# Patient Record
Sex: Female | Born: 1955 | Race: Black or African American | Hispanic: No | Marital: Married | Smoking: Current every day smoker
Health system: Southern US, Community
[De-identification: ages and names within clinical notes are randomized; demographics above are authoritative.]

## PROBLEM LIST (undated history)

## (undated) DIAGNOSIS — M311 Thrombotic microangiopathy: Secondary | ICD-10-CM

## (undated) DIAGNOSIS — F329 Major depressive disorder, single episode, unspecified: Secondary | ICD-10-CM

## (undated) DIAGNOSIS — R52 Pain, unspecified: Secondary | ICD-10-CM

## (undated) DIAGNOSIS — G4733 Obstructive sleep apnea (adult) (pediatric): Secondary | ICD-10-CM

## (undated) DIAGNOSIS — J449 Chronic obstructive pulmonary disease, unspecified: Secondary | ICD-10-CM

## (undated) DIAGNOSIS — E7800 Pure hypercholesterolemia, unspecified: Secondary | ICD-10-CM

## (undated) DIAGNOSIS — K219 Gastro-esophageal reflux disease without esophagitis: Secondary | ICD-10-CM

## (undated) DIAGNOSIS — R252 Cramp and spasm: Secondary | ICD-10-CM

## (undated) DIAGNOSIS — IMO0002 Reserved for concepts with insufficient information to code with codable children: Secondary | ICD-10-CM

## (undated) DIAGNOSIS — E559 Vitamin D deficiency, unspecified: Secondary | ICD-10-CM

## (undated) DIAGNOSIS — I10 Essential (primary) hypertension: Secondary | ICD-10-CM

## (undated) DIAGNOSIS — M1711 Unilateral primary osteoarthritis, right knee: Secondary | ICD-10-CM

## (undated) DIAGNOSIS — N183 Chronic kidney disease, stage 3 (moderate): Secondary | ICD-10-CM

## (undated) DIAGNOSIS — R1310 Dysphagia, unspecified: Secondary | ICD-10-CM

## (undated) DIAGNOSIS — M797 Fibromyalgia: Secondary | ICD-10-CM

## (undated) DIAGNOSIS — M1990 Unspecified osteoarthritis, unspecified site: Secondary | ICD-10-CM

## (undated) DIAGNOSIS — I639 Cerebral infarction, unspecified: Secondary | ICD-10-CM

## (undated) DIAGNOSIS — E538 Deficiency of other specified B group vitamins: Secondary | ICD-10-CM

## (undated) DIAGNOSIS — F32A Depression, unspecified: Secondary | ICD-10-CM

## (undated) DIAGNOSIS — Z9889 Other specified postprocedural states: Secondary | ICD-10-CM

## (undated) DIAGNOSIS — I2510 Atherosclerotic heart disease of native coronary artery without angina pectoris: Secondary | ICD-10-CM

## (undated) DIAGNOSIS — R7303 Prediabetes: Secondary | ICD-10-CM

## (undated) DIAGNOSIS — R569 Unspecified convulsions: Secondary | ICD-10-CM

## (undated) DIAGNOSIS — M329 Systemic lupus erythematosus, unspecified: Secondary | ICD-10-CM

## (undated) DIAGNOSIS — R519 Headache, unspecified: Secondary | ICD-10-CM

## (undated) DIAGNOSIS — Z8739 Personal history of other diseases of the musculoskeletal system and connective tissue: Secondary | ICD-10-CM

## (undated) DIAGNOSIS — R0600 Dyspnea, unspecified: Secondary | ICD-10-CM

## (undated) DIAGNOSIS — Z9989 Dependence on other enabling machines and devices: Secondary | ICD-10-CM

## (undated) DIAGNOSIS — M19011 Primary osteoarthritis, right shoulder: Secondary | ICD-10-CM

## (undated) DIAGNOSIS — B019 Varicella without complication: Secondary | ICD-10-CM

## (undated) DIAGNOSIS — Z8673 Personal history of transient ischemic attack (TIA), and cerebral infarction without residual deficits: Secondary | ICD-10-CM

## (undated) DIAGNOSIS — J45909 Unspecified asthma, uncomplicated: Secondary | ICD-10-CM

## (undated) DIAGNOSIS — M3119 Other thrombotic microangiopathy: Secondary | ICD-10-CM

## (undated) DIAGNOSIS — R410 Disorientation, unspecified: Secondary | ICD-10-CM

## (undated) DIAGNOSIS — M359 Systemic involvement of connective tissue, unspecified: Secondary | ICD-10-CM

## (undated) DIAGNOSIS — J309 Allergic rhinitis, unspecified: Secondary | ICD-10-CM

## (undated) DIAGNOSIS — M7540 Impingement syndrome of unspecified shoulder: Secondary | ICD-10-CM

## (undated) HISTORY — PX: REPLACEMENT TOTAL KNEE: SUR1224

## (undated) HISTORY — DX: Chronic obstructive pulmonary disease, unspecified: J44.9

## (undated) HISTORY — DX: Reserved for concepts with insufficient information to code with codable children: IMO0002

## (undated) HISTORY — DX: Personal history of transient ischemic attack (TIA), and cerebral infarction without residual deficits: Z86.73

## (undated) HISTORY — DX: Dependence on other enabling machines and devices: Z99.89

## (undated) HISTORY — PX: ROTATOR CUFF REPAIR: SHX139

## (undated) HISTORY — DX: Thrombotic microangiopathy: M31.1

## (undated) HISTORY — DX: Unilateral primary osteoarthritis, right knee: M17.11

## (undated) HISTORY — DX: Chronic kidney disease, stage 3 (moderate): N18.3

## (undated) HISTORY — DX: Impingement syndrome of unspecified shoulder: M75.40

## (undated) HISTORY — DX: Obstructive sleep apnea (adult) (pediatric): G47.33

## (undated) HISTORY — DX: Atherosclerotic heart disease of native coronary artery without angina pectoris: I25.10

## (undated) HISTORY — PX: ABDOMINAL HYSTERECTOMY: SHX81

## (undated) HISTORY — DX: Pure hypercholesterolemia, unspecified: E78.00

---

## 2004-04-02 ENCOUNTER — Emergency Department: Payer: Self-pay | Admitting: Emergency Medicine

## 2004-04-03 ENCOUNTER — Other Ambulatory Visit: Payer: Self-pay

## 2004-04-03 ENCOUNTER — Inpatient Hospital Stay: Payer: Self-pay | Admitting: Internal Medicine

## 2004-04-13 ENCOUNTER — Ambulatory Visit: Payer: Self-pay | Admitting: Internal Medicine

## 2004-05-05 ENCOUNTER — Ambulatory Visit: Payer: Self-pay | Admitting: Internal Medicine

## 2004-06-04 ENCOUNTER — Ambulatory Visit: Payer: Self-pay | Admitting: Internal Medicine

## 2004-06-26 ENCOUNTER — Ambulatory Visit: Payer: Self-pay | Admitting: Family Medicine

## 2004-07-05 ENCOUNTER — Ambulatory Visit: Payer: Self-pay | Admitting: Internal Medicine

## 2004-08-04 ENCOUNTER — Ambulatory Visit: Payer: Self-pay | Admitting: Internal Medicine

## 2004-09-04 ENCOUNTER — Ambulatory Visit: Payer: Self-pay | Admitting: Internal Medicine

## 2004-10-05 ENCOUNTER — Ambulatory Visit: Payer: Self-pay | Admitting: Internal Medicine

## 2004-11-04 ENCOUNTER — Ambulatory Visit: Payer: Self-pay | Admitting: Internal Medicine

## 2004-12-05 ENCOUNTER — Ambulatory Visit: Payer: Self-pay | Admitting: Internal Medicine

## 2005-09-10 ENCOUNTER — Emergency Department: Payer: Self-pay | Admitting: Emergency Medicine

## 2005-10-15 ENCOUNTER — Ambulatory Visit: Payer: Self-pay | Admitting: Internal Medicine

## 2006-02-05 ENCOUNTER — Ambulatory Visit: Payer: Self-pay | Admitting: Gastroenterology

## 2006-02-17 ENCOUNTER — Ambulatory Visit: Payer: Self-pay | Admitting: Internal Medicine

## 2006-09-02 ENCOUNTER — Ambulatory Visit: Payer: Self-pay | Admitting: Orthopaedic Surgery

## 2006-10-02 ENCOUNTER — Ambulatory Visit: Payer: Self-pay | Admitting: Pain Medicine

## 2006-11-03 ENCOUNTER — Ambulatory Visit: Payer: Self-pay | Admitting: Pain Medicine

## 2006-11-17 ENCOUNTER — Ambulatory Visit: Payer: Self-pay | Admitting: Pain Medicine

## 2006-12-11 ENCOUNTER — Ambulatory Visit: Payer: Self-pay | Admitting: Emergency Medicine

## 2006-12-25 ENCOUNTER — Ambulatory Visit: Payer: Self-pay | Admitting: Pain Medicine

## 2007-01-05 DIAGNOSIS — Z8673 Personal history of transient ischemic attack (TIA), and cerebral infarction without residual deficits: Secondary | ICD-10-CM

## 2007-01-05 HISTORY — DX: Personal history of transient ischemic attack (TIA), and cerebral infarction without residual deficits: Z86.73

## 2007-01-12 ENCOUNTER — Inpatient Hospital Stay: Payer: Self-pay | Admitting: Internal Medicine

## 2007-01-12 ENCOUNTER — Other Ambulatory Visit: Payer: Self-pay

## 2007-04-10 ENCOUNTER — Ambulatory Visit: Payer: Self-pay | Admitting: Pain Medicine

## 2007-04-27 ENCOUNTER — Ambulatory Visit: Payer: Self-pay | Admitting: Pain Medicine

## 2007-07-16 ENCOUNTER — Ambulatory Visit: Payer: Self-pay | Admitting: Pain Medicine

## 2007-07-27 ENCOUNTER — Ambulatory Visit: Payer: Self-pay | Admitting: Pain Medicine

## 2007-10-06 ENCOUNTER — Other Ambulatory Visit: Payer: Self-pay

## 2007-10-06 ENCOUNTER — Ambulatory Visit: Payer: Self-pay | Admitting: Unknown Physician Specialty

## 2007-10-13 ENCOUNTER — Ambulatory Visit: Payer: Self-pay | Admitting: Unknown Physician Specialty

## 2008-02-01 ENCOUNTER — Ambulatory Visit: Payer: Self-pay | Admitting: Internal Medicine

## 2008-04-11 ENCOUNTER — Ambulatory Visit: Payer: Self-pay | Admitting: Unknown Physician Specialty

## 2010-01-02 ENCOUNTER — Ambulatory Visit: Payer: Self-pay | Admitting: Unknown Physician Specialty

## 2010-01-09 ENCOUNTER — Ambulatory Visit: Payer: Self-pay | Admitting: Unknown Physician Specialty

## 2010-02-22 ENCOUNTER — Ambulatory Visit: Payer: Self-pay | Admitting: Unknown Physician Specialty

## 2010-03-22 ENCOUNTER — Ambulatory Visit: Payer: Self-pay | Admitting: Internal Medicine

## 2010-06-24 ENCOUNTER — Ambulatory Visit: Payer: Self-pay | Admitting: Surgery

## 2011-04-24 ENCOUNTER — Ambulatory Visit: Payer: Self-pay | Admitting: Internal Medicine

## 2011-05-22 ENCOUNTER — Ambulatory Visit: Payer: Self-pay | Admitting: Gastroenterology

## 2012-04-28 ENCOUNTER — Ambulatory Visit: Payer: Self-pay | Admitting: Internal Medicine

## 2013-01-07 ENCOUNTER — Ambulatory Visit: Payer: Self-pay | Admitting: Internal Medicine

## 2013-01-21 ENCOUNTER — Ambulatory Visit: Payer: Self-pay | Admitting: Gastroenterology

## 2013-01-22 LAB — PATHOLOGY REPORT

## 2013-04-14 DIAGNOSIS — M359 Systemic involvement of connective tissue, unspecified: Secondary | ICD-10-CM | POA: Insufficient documentation

## 2013-04-14 DIAGNOSIS — M1990 Unspecified osteoarthritis, unspecified site: Secondary | ICD-10-CM | POA: Insufficient documentation

## 2013-04-14 DIAGNOSIS — I2510 Atherosclerotic heart disease of native coronary artery without angina pectoris: Secondary | ICD-10-CM

## 2013-04-14 HISTORY — DX: Atherosclerotic heart disease of native coronary artery without angina pectoris: I25.10

## 2013-05-10 ENCOUNTER — Ambulatory Visit: Payer: Self-pay | Admitting: Obstetrics and Gynecology

## 2013-05-18 ENCOUNTER — Ambulatory Visit: Payer: Self-pay | Admitting: Internal Medicine

## 2013-06-14 ENCOUNTER — Emergency Department: Payer: Self-pay | Admitting: Emergency Medicine

## 2013-07-27 ENCOUNTER — Ambulatory Visit: Payer: Self-pay | Admitting: Rheumatology

## 2013-08-23 DIAGNOSIS — IMO0002 Reserved for concepts with insufficient information to code with codable children: Secondary | ICD-10-CM | POA: Insufficient documentation

## 2013-08-23 DIAGNOSIS — M1711 Unilateral primary osteoarthritis, right knee: Secondary | ICD-10-CM

## 2013-08-23 HISTORY — DX: Unilateral primary osteoarthritis, right knee: M17.11

## 2013-08-23 HISTORY — DX: Reserved for concepts with insufficient information to code with codable children: IMO0002

## 2013-10-07 DIAGNOSIS — Z9989 Dependence on other enabling machines and devices: Secondary | ICD-10-CM

## 2013-10-07 DIAGNOSIS — E7800 Pure hypercholesterolemia, unspecified: Secondary | ICD-10-CM

## 2013-10-07 DIAGNOSIS — J449 Chronic obstructive pulmonary disease, unspecified: Secondary | ICD-10-CM

## 2013-10-07 DIAGNOSIS — E538 Deficiency of other specified B group vitamins: Secondary | ICD-10-CM | POA: Insufficient documentation

## 2013-10-07 DIAGNOSIS — G4700 Insomnia, unspecified: Secondary | ICD-10-CM | POA: Insufficient documentation

## 2013-10-07 DIAGNOSIS — N183 Chronic kidney disease, stage 3 unspecified: Secondary | ICD-10-CM | POA: Insufficient documentation

## 2013-10-07 DIAGNOSIS — M311 Thrombotic microangiopathy: Secondary | ICD-10-CM | POA: Insufficient documentation

## 2013-10-07 DIAGNOSIS — I10 Essential (primary) hypertension: Secondary | ICD-10-CM | POA: Insufficient documentation

## 2013-10-07 DIAGNOSIS — L732 Hidradenitis suppurativa: Secondary | ICD-10-CM | POA: Insufficient documentation

## 2013-10-07 DIAGNOSIS — M7540 Impingement syndrome of unspecified shoulder: Secondary | ICD-10-CM

## 2013-10-07 DIAGNOSIS — E559 Vitamin D deficiency, unspecified: Secondary | ICD-10-CM | POA: Insufficient documentation

## 2013-10-07 DIAGNOSIS — G4733 Obstructive sleep apnea (adult) (pediatric): Secondary | ICD-10-CM | POA: Insufficient documentation

## 2013-10-07 DIAGNOSIS — M3119 Other thrombotic microangiopathy: Secondary | ICD-10-CM

## 2013-10-07 HISTORY — DX: Chronic kidney disease, stage 3 unspecified: N18.30

## 2013-10-07 HISTORY — DX: Obstructive sleep apnea (adult) (pediatric): G47.33

## 2013-10-07 HISTORY — DX: Chronic obstructive pulmonary disease, unspecified: J44.9

## 2013-10-07 HISTORY — DX: Impingement syndrome of unspecified shoulder: M75.40

## 2013-10-07 HISTORY — DX: Pure hypercholesterolemia, unspecified: E78.00

## 2013-10-07 HISTORY — DX: Other thrombotic microangiopathy: M31.19

## 2013-10-18 ENCOUNTER — Ambulatory Visit: Payer: Self-pay | Admitting: Unknown Physician Specialty

## 2014-01-11 ENCOUNTER — Ambulatory Visit: Payer: Self-pay | Admitting: Orthopedic Surgery

## 2014-02-04 HISTORY — PX: JOINT REPLACEMENT: SHX530

## 2014-02-09 ENCOUNTER — Ambulatory Visit: Payer: Self-pay | Admitting: Orthopedic Surgery

## 2014-02-09 LAB — URINALYSIS, COMPLETE
Bacteria: NONE SEEN
Bilirubin,UR: NEGATIVE
Blood: NEGATIVE
Glucose,UR: NEGATIVE mg/dL (ref 0–75)
Ketone: NEGATIVE
Leukocyte Esterase: NEGATIVE
Nitrite: NEGATIVE
Ph: 5 (ref 4.5–8.0)
Protein: NEGATIVE
RBC,UR: <1 /HPF (ref 0–5)
Specific Gravity: 1.01 (ref 1.003–1.030)
Squamous Epithelial: NONE SEEN
WBC UR: <1 /HPF (ref 0–5)

## 2014-02-09 LAB — BASIC METABOLIC PANEL
Anion Gap: 5 — ABNORMAL LOW (ref 7–16)
BUN: 20 mg/dL — ABNORMAL HIGH (ref 7–18)
Calcium, Total: 8.5 mg/dL (ref 8.5–10.1)
Chloride: 106 mmol/L (ref 98–107)
Co2: 29 mmol/L (ref 21–32)
Creatinine: 1.24 mg/dL (ref 0.60–1.30)
EGFR (African American): 57 — ABNORMAL LOW
EGFR (Non-African Amer.): 47 — ABNORMAL LOW
Glucose: 91 mg/dL (ref 65–99)
Osmolality: 282 (ref 275–301)
Potassium: 3.6 mmol/L (ref 3.5–5.1)
Sodium: 140 mmol/L (ref 136–145)

## 2014-02-09 LAB — CBC
HCT: 36.2 % (ref 35.0–47.0)
HGB: 11.5 g/dL — ABNORMAL LOW (ref 12.0–16.0)
MCH: 28.7 pg (ref 26.0–34.0)
MCHC: 31.9 g/dL — ABNORMAL LOW (ref 32.0–36.0)
MCV: 90 fL (ref 80–100)
Platelet: 268 10*3/uL (ref 150–440)
RBC: 4.02 10*6/uL (ref 3.80–5.20)
RDW: 13.7 % (ref 11.5–14.5)
WBC: 8.2 10*3/uL (ref 3.6–11.0)

## 2014-02-09 LAB — MRSA PCR SCREENING

## 2014-02-09 LAB — APTT: Activated PTT: 31 secs (ref 23.6–35.9)

## 2014-02-09 LAB — PROTIME-INR
INR: 1.1
Prothrombin Time: 13.9 secs (ref 11.5–14.7)

## 2014-02-09 LAB — SEDIMENTATION RATE: Erythrocyte Sed Rate: 41 mm/hr — ABNORMAL HIGH (ref 0–30)

## 2014-02-11 LAB — URINE CULTURE

## 2014-02-22 ENCOUNTER — Inpatient Hospital Stay: Payer: Self-pay | Admitting: Orthopedic Surgery

## 2014-02-23 LAB — BASIC METABOLIC PANEL
Anion Gap: 6 — ABNORMAL LOW (ref 7–16)
BUN: 15 mg/dL (ref 7–18)
Calcium, Total: 7.8 mg/dL — ABNORMAL LOW (ref 8.5–10.1)
Chloride: 102 mmol/L (ref 98–107)
Co2: 25 mmol/L (ref 21–32)
Creatinine: 1.2 mg/dL (ref 0.60–1.30)
EGFR (African American): 60 — ABNORMAL LOW
EGFR (Non-African Amer.): 49 — ABNORMAL LOW
Glucose: 132 mg/dL — ABNORMAL HIGH (ref 65–99)
Osmolality: 269 (ref 275–301)
Potassium: 3.8 mmol/L (ref 3.5–5.1)
Sodium: 133 mmol/L — ABNORMAL LOW (ref 136–145)

## 2014-02-23 LAB — PLATELET COUNT: Platelet: 236 10*3/uL (ref 150–440)

## 2014-02-23 LAB — HEMOGLOBIN: HGB: 9.7 g/dL — ABNORMAL LOW (ref 12.0–16.0)

## 2014-02-24 LAB — BASIC METABOLIC PANEL
Anion Gap: 6 — ABNORMAL LOW (ref 7–16)
BUN: 8 mg/dL (ref 7–18)
Calcium, Total: 8.3 mg/dL — ABNORMAL LOW (ref 8.5–10.1)
Chloride: 102 mmol/L (ref 98–107)
Co2: 28 mmol/L (ref 21–32)
Creatinine: 0.95 mg/dL (ref 0.60–1.30)
EGFR (African American): >60
EGFR (Non-African Amer.): >60
Glucose: 112 mg/dL — ABNORMAL HIGH (ref 65–99)
Osmolality: 271 (ref 275–301)
Potassium: 3.8 mmol/L (ref 3.5–5.1)
Sodium: 136 mmol/L (ref 136–145)

## 2014-02-24 LAB — HEMOGLOBIN: HGB: 9.6 g/dL — ABNORMAL LOW (ref 12.0–16.0)

## 2014-05-30 LAB — SURGICAL PATHOLOGY

## 2014-06-05 NOTE — Op Note (Signed)
PATIENT NAME:  Bailey Dominguez, Bailey Dominguez MR#:  [id number] DATE OF BIRTH:  1955-05-06  DATE OF PROCEDURE:  02/22/2014  PREOPERATIVE DIAGNOSIS: Right knee primary osteoarthritis.   POSTOPERATIVE DIAGNOSIS:  Right knee primary osteoarthritis.     PROCEDURE: Right total knee replacement.   SURGEON: Hessie Knows, MD  ASSISTANT:  Rachelle Hora, PA-C.   ANESTHESIA: Spinal.   DESCRIPTION OF PROCEDURE: The patient was brought to the Operating Room and after adequate anesthesia was obtained, the right leg was prepped and draped in the usual sterile fashion with tourniquet applied to the upper thigh  The patient identification and timeout procedures were completed.  The leg was exsanguinated with an Esmarch and the tourniquet raised to 300 mmHg.  A midline skin incision was made, followed by a medial parapatellar arthrotomy with clearly complete loss of articular cartilage in the medial femoral condyle and large portions of the medial tibial condyles as well as patellofemoral joint, trochlea and patella. The lateral compartment was relatively spared with just pitting of the articular cartilage. There is also moderate synovitis of the suprapatellar pouch.  ACL and PCL were excised along with the fat pad and the proximal tibia exposed and Medacta cutting guide was applied and the proximal tibia cut carried out, followed by the identical procedure on the distal femur.  After the distal femur cut, the 2 cutting guide was applied, anterior, posterior and chamfer cuts created, and trials were placed and a long stemmed tibia endplate was utilized since the patient had soft bone and because of her weight, so a proximal drill hole was made, followed by hand reaming.  The trials were placed and there was good patellofemoral tracking and a size 10 was somewhat lax and 11 fit very well.  The distal femoral drill holes were made in the trochlear groove.  Notch was created using the guide, and trials were removed at this point and the  patella cut using the patellar cutting guide, resecting appropriate amount patella was drilled, and the patella sized to a size 1. At this point, the periarticular tissue was infiltrated with a combination of dilute Exparel as well as a combination of morphine and 0.25% Sensorcaine with epinephrine.  The bony surfaces were thoroughly irrigated and dried. Tibial component was cemented into place first, followed by the femoral component with an 11 mm trial. Trial fit very well and so the 11 mm final insert was placed and set screws placed as well. The patella was cemented into place as well and the clamp used to hold this, the knee was held in extension, as the cement set.  After the cement had set, excess cement was removed.  The knee was thoroughly irrigated and then a Betadine soak for 5 minutes was carried out.  Tourniquet was let down and hemostasis checked with electrocautery. The wound was irrigated and then closed with a heavy Quill suture, 2-0 Quill subcutaneously and skin staples.  Xeroform, 4 x 4's, ABDs, Webril Polar Care and Ace wrap applied. The patient was sent to the recovery room in stable condition.   ESTIMATED BLOOD LOSS: 50.   COMPLICATIONS: None.   SPECIMEN: Cut ends of bone.   TOURNIQUET TIME: 80 minutes at 300 mmHg.   IMPLANTS: Medacta GMK sphere, 2 right femur with an 11 mm, 2 right Flex insert. A 2 right tibial tray with a 11 x 65 mm stem attachment and a size 1 patella.  All components cemented.    CONDITION TO RECOVERY ROOM:  Stable.  COMPLICATIONS: None.      ____________________________ Laurene Footman, MD mjm:DT D: 02/22/2014 11:47:47 ET T: 02/22/2014 12:25:34 ET JOB#: [id number]  cc: Laurene Footman, MD, <Dictator> Laurene Footman MD ELECTRONICALLY SIGNED 02/22/2014 19:00

## 2015-03-07 ENCOUNTER — Other Ambulatory Visit: Payer: Self-pay | Admitting: Rheumatology

## 2015-03-07 DIAGNOSIS — M542 Cervicalgia: Secondary | ICD-10-CM

## 2015-03-29 ENCOUNTER — Ambulatory Visit
Admission: RE | Admit: 2015-03-29 | Discharge: 2015-03-29 | Disposition: A | Discharge status: Home / Self Care | Payer: Medicare Other | Source: Ambulatory Visit | Attending: Rheumatology | Admitting: Rheumatology

## 2015-03-29 DIAGNOSIS — M4802 Spinal stenosis, cervical region: Secondary | ICD-10-CM | POA: Diagnosis not present

## 2015-03-29 DIAGNOSIS — M4692 Unspecified inflammatory spondylopathy, cervical region: Secondary | ICD-10-CM | POA: Insufficient documentation

## 2015-03-29 DIAGNOSIS — M50221 Other cervical disc displacement at C4-C5 level: Secondary | ICD-10-CM | POA: Diagnosis not present

## 2015-03-29 DIAGNOSIS — M542 Cervicalgia: Secondary | ICD-10-CM | POA: Diagnosis present

## 2015-04-21 DIAGNOSIS — Z79899 Other long term (current) drug therapy: Secondary | ICD-10-CM | POA: Insufficient documentation

## 2015-04-24 ENCOUNTER — Other Ambulatory Visit: Payer: Self-pay | Admitting: Internal Medicine

## 2015-04-24 DIAGNOSIS — Z1231 Encounter for screening mammogram for malignant neoplasm of breast: Secondary | ICD-10-CM

## 2015-05-01 ENCOUNTER — Other Ambulatory Visit: Payer: Self-pay | Admitting: Physician Assistant

## 2015-05-01 DIAGNOSIS — R1319 Other dysphagia: Secondary | ICD-10-CM

## 2015-05-09 ENCOUNTER — Ambulatory Visit
Admission: RE | Admit: 2015-05-09 | Discharge: 2015-05-09 | Disposition: A | Discharge status: Home / Self Care | Payer: Medicare Other | Source: Ambulatory Visit | Attending: Physician Assistant | Admitting: Physician Assistant

## 2015-05-09 DIAGNOSIS — K219 Gastro-esophageal reflux disease without esophagitis: Secondary | ICD-10-CM | POA: Diagnosis present

## 2015-05-09 DIAGNOSIS — R1319 Other dysphagia: Secondary | ICD-10-CM | POA: Diagnosis not present

## 2015-08-14 ENCOUNTER — Encounter: Payer: Self-pay | Admitting: *Deleted

## 2015-08-15 ENCOUNTER — Ambulatory Visit: Payer: Medicare Other | Admitting: Anesthesiology

## 2015-08-15 ENCOUNTER — Encounter
Admission: RE | Disposition: A | Discharge status: Home / Self Care | Payer: Self-pay | Source: Ambulatory Visit | Attending: Gastroenterology

## 2015-08-15 ENCOUNTER — Encounter: Payer: Self-pay | Admitting: *Deleted

## 2015-08-15 ENCOUNTER — Ambulatory Visit
Admission: RE | Admit: 2015-08-15 | Discharge: 2015-08-15 | Disposition: A | Discharge status: Home / Self Care | Payer: Medicare Other | Source: Ambulatory Visit | Attending: Gastroenterology | Admitting: Gastroenterology

## 2015-08-15 DIAGNOSIS — Z79899 Other long term (current) drug therapy: Secondary | ICD-10-CM | POA: Insufficient documentation

## 2015-08-15 DIAGNOSIS — J449 Chronic obstructive pulmonary disease, unspecified: Secondary | ICD-10-CM | POA: Diagnosis not present

## 2015-08-15 DIAGNOSIS — Z885 Allergy status to narcotic agent status: Secondary | ICD-10-CM | POA: Diagnosis not present

## 2015-08-15 DIAGNOSIS — Z862 Personal history of diseases of the blood and blood-forming organs and certain disorders involving the immune mechanism: Secondary | ICD-10-CM | POA: Diagnosis not present

## 2015-08-15 DIAGNOSIS — Z6833 Body mass index (BMI) 33.0-33.9, adult: Secondary | ICD-10-CM | POA: Insufficient documentation

## 2015-08-15 DIAGNOSIS — E559 Vitamin D deficiency, unspecified: Secondary | ICD-10-CM | POA: Insufficient documentation

## 2015-08-15 DIAGNOSIS — K222 Esophageal obstruction: Secondary | ICD-10-CM | POA: Insufficient documentation

## 2015-08-15 DIAGNOSIS — G4733 Obstructive sleep apnea (adult) (pediatric): Secondary | ICD-10-CM | POA: Insufficient documentation

## 2015-08-15 DIAGNOSIS — Z7902 Long term (current) use of antithrombotics/antiplatelets: Secondary | ICD-10-CM | POA: Insufficient documentation

## 2015-08-15 DIAGNOSIS — F329 Major depressive disorder, single episode, unspecified: Secondary | ICD-10-CM | POA: Insufficient documentation

## 2015-08-15 DIAGNOSIS — Z87891 Personal history of nicotine dependence: Secondary | ICD-10-CM | POA: Diagnosis not present

## 2015-08-15 DIAGNOSIS — E538 Deficiency of other specified B group vitamins: Secondary | ICD-10-CM | POA: Diagnosis not present

## 2015-08-15 DIAGNOSIS — M1990 Unspecified osteoarthritis, unspecified site: Secondary | ICD-10-CM | POA: Insufficient documentation

## 2015-08-15 DIAGNOSIS — J309 Allergic rhinitis, unspecified: Secondary | ICD-10-CM | POA: Diagnosis not present

## 2015-08-15 DIAGNOSIS — I10 Essential (primary) hypertension: Secondary | ICD-10-CM | POA: Diagnosis not present

## 2015-08-15 DIAGNOSIS — R1310 Dysphagia, unspecified: Secondary | ICD-10-CM | POA: Diagnosis present

## 2015-08-15 DIAGNOSIS — K2950 Unspecified chronic gastritis without bleeding: Secondary | ICD-10-CM | POA: Insufficient documentation

## 2015-08-15 DIAGNOSIS — Z8673 Personal history of transient ischemic attack (TIA), and cerebral infarction without residual deficits: Secondary | ICD-10-CM | POA: Diagnosis not present

## 2015-08-15 DIAGNOSIS — K228 Other specified diseases of esophagus: Secondary | ICD-10-CM | POA: Insufficient documentation

## 2015-08-15 DIAGNOSIS — Z791 Long term (current) use of non-steroidal anti-inflammatories (NSAID): Secondary | ICD-10-CM | POA: Diagnosis not present

## 2015-08-15 DIAGNOSIS — Z79891 Long term (current) use of opiate analgesic: Secondary | ICD-10-CM | POA: Insufficient documentation

## 2015-08-15 DIAGNOSIS — K219 Gastro-esophageal reflux disease without esophagitis: Secondary | ICD-10-CM | POA: Diagnosis present

## 2015-08-15 HISTORY — DX: Systemic involvement of connective tissue, unspecified: M35.9

## 2015-08-15 HISTORY — DX: Gastro-esophageal reflux disease without esophagitis: K21.9

## 2015-08-15 HISTORY — DX: Cramp and spasm: R25.2

## 2015-08-15 HISTORY — DX: Chronic obstructive pulmonary disease, unspecified: J44.9

## 2015-08-15 HISTORY — DX: Major depressive disorder, single episode, unspecified: F32.9

## 2015-08-15 HISTORY — DX: Thrombotic microangiopathy: M31.1

## 2015-08-15 HISTORY — DX: Obstructive sleep apnea (adult) (pediatric): G47.33

## 2015-08-15 HISTORY — DX: Vitamin D deficiency, unspecified: E55.9

## 2015-08-15 HISTORY — DX: Varicella without complication: B01.9

## 2015-08-15 HISTORY — DX: Depression, unspecified: F32.A

## 2015-08-15 HISTORY — DX: Allergic rhinitis, unspecified: J30.9

## 2015-08-15 HISTORY — DX: Personal history of other diseases of the musculoskeletal system and connective tissue: Z87.39

## 2015-08-15 HISTORY — DX: Dependence on other enabling machines and devices: Z99.89

## 2015-08-15 HISTORY — PX: ESOPHAGOGASTRODUODENOSCOPY (EGD) WITH PROPOFOL: SHX5813

## 2015-08-15 HISTORY — DX: Dysphagia, unspecified: R13.10

## 2015-08-15 HISTORY — DX: Other thrombotic microangiopathy: M31.19

## 2015-08-15 HISTORY — DX: Unspecified osteoarthritis, unspecified site: M19.90

## 2015-08-15 HISTORY — DX: Essential (primary) hypertension: I10

## 2015-08-15 HISTORY — DX: Deficiency of other specified B group vitamins: E53.8

## 2015-08-15 SURGERY — ESOPHAGOGASTRODUODENOSCOPY (EGD) WITH PROPOFOL
Anesthesia: General

## 2015-08-15 MED ORDER — SODIUM CHLORIDE 0.9 % IV SOLN
INTRAVENOUS | Status: DC
  Administered 2015-08-15: 1000 mL via INTRAVENOUS
  Administered 2015-08-15: 13:00:00 via INTRAVENOUS

## 2015-08-15 MED ORDER — FENTANYL CITRATE (PF) 100 MCG/2ML IJ SOLN
INTRAMUSCULAR | Status: DC | PRN
  Administered 2015-08-15: 50 ug via INTRAVENOUS

## 2015-08-15 MED ORDER — MIDAZOLAM HCL 5 MG/5ML IJ SOLN
INTRAMUSCULAR | Status: DC | PRN
  Administered 2015-08-15: 1 mg via INTRAVENOUS

## 2015-08-15 MED ORDER — SODIUM CHLORIDE 0.9 % IV SOLN: INTRAVENOUS | Status: DC

## 2015-08-15 MED ORDER — PROPOFOL 500 MG/50ML IV EMUL
INTRAVENOUS | Status: DC | PRN
Start: 2015-08-15 — End: 2015-08-15
  Administered 2015-08-15: 160 ug/kg/min via INTRAVENOUS

## 2015-08-15 MED ORDER — SODIUM CHLORIDE 0.9 % IV SOLN
2.0000 g | Freq: Once | INTRAVENOUS | Status: AC
  Administered 2015-08-15: 2 g via INTRAVENOUS
  Filled 2015-08-15: qty 2000

## 2015-08-15 MED ORDER — LIDOCAINE 2% (20 MG/ML) 5 ML SYRINGE
INTRAMUSCULAR | Status: DC | PRN
  Administered 2015-08-15: 40 mg via INTRAVENOUS

## 2015-08-15 MED ORDER — PROPOFOL 10 MG/ML IV BOLUS
INTRAVENOUS | Status: DC | PRN
  Administered 2015-08-15: 100 mg via INTRAVENOUS

## 2015-08-15 NOTE — H&P (Signed)
Outpatient short stay form Pre-procedure 08/15/2015 1:08 PM Lollie Sails MD  Primary Physician: Dr. Genene Churn  Reason for visit:  EGD  History of present illness:  Patient is a 60 year old female presenting today as above. She states that over the period past year she has had some intermittent difficulties with cervical dysphagia. There is no odynophagia. She does take Celebrex and NSAIDs multiple times daily. She is on Protonix 40 mg daily. She had a barium swallow done on 05/09/2015 with a finding of prominence of the cricopharyngeus muscle otherwise negative. The standardized barium tablet passed without difficulty. Patient is on dual antiplatelet treatment due to her history of TTP and embolic stroke. She has held this medications or 6 days.    Current facility-administered medications:  .  0.9 %  sodium chloride infusion, , Intravenous, Continuous, Lollie Sails, MD, Last Rate: 20 mL/hr at 08/15/15 1246 .  0.9 %  sodium chloride infusion, , Intravenous, Continuous, Lollie Sails, MD .  ampicillin (OMNIPEN) 2 g in sodium chloride 0.9 % 50 mL IVPB, 2 g, Intravenous, Once, Lollie Sails, MD, 2 g at 08/15/15 1251  Prescriptions prior to admission  Medication Sig Dispense Refill Last Dose  . albuterol (PROVENTIL HFA;VENTOLIN HFA) 108 (90 Base) MCG/ACT inhaler Inhale 2 puffs into the lungs every 6 (six) hours as needed for wheezing or shortness of breath.     . bisoprolol (ZEBETA) 5 MG tablet Take 5 mg by mouth daily.   08/15/2015 at 0630  . celecoxib (CELEBREX) 200 MG capsule Take 200 mg by mouth 2 (two) times daily.     . clopidogrel (PLAVIX) 75 MG tablet Take 75 mg by mouth daily.     . cyclobenzaprine (FLEXERIL) 10 MG tablet Take 10 mg by mouth 2 (two) times daily as needed for muscle spasms.     . diazepam (VALIUM) 5 MG tablet Take 5 mg by mouth.     . gabapentin (NEURONTIN) 300 MG capsule Take 300 mg by mouth at bedtime.     . hydrochlorothiazide (HYDRODIURIL) 25 MG  tablet Take 25 mg by mouth daily.   08/15/2015 at 0630  . HYDROcodone-acetaminophen (NORCO/VICODIN) 5-325 MG tablet Take 1 tablet by mouth 2 (two) times daily as needed for moderate pain.     . hydroxychloroquine (PLAQUENIL) 200 MG tablet Take 400 mg by mouth daily.     . pantoprazole (PROTONIX) 40 MG tablet Take 40 mg by mouth daily.     . pravastatin (PRAVACHOL) 40 MG tablet Take 40 mg by mouth at bedtime.     . senna (SENOKOT) 8.6 MG tablet Take 2 tablets by mouth daily as needed for constipation.     Marland Kitchen spironolactone (ALDACTONE) 25 MG tablet Take 25 mg by mouth daily.   08/15/2015 at 0630  . tiZANidine (ZANAFLEX) 4 MG capsule Take 4 mg by mouth every 8 (eight) hours as needed for muscle spasms.     . vitamin B-12 (CYANOCOBALAMIN) 500 MCG tablet Take 500 mcg by mouth daily.     Marland Kitchen zolpidem (AMBIEN) 10 MG tablet Take 5 mg by mouth at bedtime as needed for sleep.        Allergies  Allergen Reactions  . Codeine      Past Medical History  Diagnosis Date  . Dysphagia   . Allergic rhinitis   . B12 deficiency   . Chickenpox   . H/O degenerative disc disease   . Connective tissue disease ([place])   . COPD (chronic obstructive  pulmonary disease) ([place])   . Hypertension   . Nocturnal muscle cramps   . OSA on CPAP   . Osteoarthritis   . 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 Vitamin D deficiency   . Depression   . GERD (gastroesophageal reflux disease)     Review of systems:      Physical Exam    Heart and lungs: Regular rate and rhythm without rub or gallop, lungs are bilaterally clear.    HEENT: Normocephalic atraumatic eyes are anicteric    Other:     Pertinant exam for procedure: Soft nontender nondistended bowel sounds positive normoactive.    Planned proceedures: EGD and indicated procedures. I have discussed the risks benefits and complications of procedures to include not limited to bleeding, infection, perforation and the risk of sedation and the patient wishes  to proceed.    Lollie Sails, MD Gastroenterology 08/15/2015  1:08 PM

## 2015-08-15 NOTE — Anesthesia Preprocedure Evaluation (Signed)
Anesthesia Evaluation  Patient identified by MRN, date of birth, ID band Patient awake    Reviewed: Allergy & Precautions, NPO status , Patient's Chart, lab work & pertinent test results  Airway Mallampati: II       Dental  (+) Teeth Intact   Pulmonary sleep apnea and Continuous Positive Airway Pressure Ventilation , COPD,  COPD inhaler, Current Smoker,    + rhonchi  + decreased breath sounds      Cardiovascular Exercise Tolerance: Good hypertension, Pt. on medications and Pt. on home beta blockers  Rhythm:Regular     Neuro/Psych Depression    GI/Hepatic Neg liver ROS, GERD  ,  Endo/Other  Morbid obesity  Renal/GU      Musculoskeletal   Abdominal (+) + obese,   Peds  Hematology  (+) anemia ,   Anesthesia Other Findings   Reproductive/Obstetrics                             Anesthesia Physical Anesthesia Plan  ASA: III  Anesthesia Plan: General   Post-op Pain Management:    Induction: Intravenous  Airway Management Planned: Natural Airway and Nasal Cannula  Additional Equipment:   Intra-op Plan:   Post-operative Plan:   Informed Consent: I have reviewed the patients History and Physical, chart, labs and discussed the procedure including the risks, benefits and alternatives for the proposed anesthesia with the patient or authorized representative who has indicated his/her understanding and acceptance.     Plan Discussed with: CRNA  Anesthesia Plan Comments:         Anesthesia Quick Evaluation

## 2015-08-15 NOTE — Op Note (Signed)
[facility] Gastroenterology Patient Name: Bailey Dominguez Procedure Date: 08/15/2015 1:06 PM MRN: [id number] Account #: [id number] Date of Birth: [birth year] Admit Type: Outpatient Age: 60 Room: [facility] ENDO ROOM 3 Gender: Female Note Status: Finalized Procedure:            Upper GI endoscopy Indications:          Dysphagia, Gastro-esophageal reflux disease Providers:            Lollie Sails, MD Referring MD:         Christena Flake. Raechel Ache, MD (Referring MD) Medicines:            Monitored Anesthesia Care Complications:        No immediate complications. Procedure:            Pre-Anesthesia Assessment:                       - ASA Grade Assessment: III - A patient with severe                        systemic disease.                       After obtaining informed consent, the endoscope was                        passed under direct vision. Throughout the procedure,                        the patient's blood pressure, pulse, and oxygen                        saturations were monitored continuously. The                        Colonoscope was introduced through the mouth, and                        advanced to the third part of duodenum. The upper GI                        endoscopy was accomplished without difficulty. The                        patient tolerated the procedure well. Findings:      The Z-line was irregular. Biopsies were taken with a cold forceps for       histology.      A moderate-sized area of extrinsic compression was found at the       cricopharyngeus.      The exam of the esophagus was otherwise normal.      Diffuse and patchy mild inflammation characterized by congestion       (edema), erythema and granularity was found in the entire examined       stomach. Biopsies were taken with a cold forceps for histology. Biopsies       were taken with a cold forceps for Helicobacter pylori testing.      The cardia and gastric fundus were normal on  retroflexion.      The examined duodenum was normal. Impression:           - Z-line irregular. Biopsied.                       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                       - Erosive gastritis. Biopsied.                       - Normal examined duodenum. Recommendation:       - Use Protonix (pantoprazole) 40 mg PO BID for 1 month.                       - Use Protonix (pantoprazole) 40 mg PO daily daily.                       - Use sucralfate suspension 1 gram PO QID for 1 month. Procedure Code(s):    --- Professional ---                       [phone], Esophagogastroduodenoscopy, flexible, transoral;                        with biopsy, single or multiple Diagnosis Code(s):    --- Professional ---                       K22.8, Other specified diseases of esophagus                       K22.2, Esophageal obstruction                       K29.60, Other gastritis without bleeding                       R13.10, Dysphagia, unspecified                       K21.9, Gastro-esophageal reflux disease without                        esophagitis CPT copyright 2016 American Medical Association. All rights reserved. The codes documented in this report are preliminary and upon coder review may  be revised to meet current compliance requirements. Lollie Sails, MD 08/15/2015 1:31:07 PM This report has been signed electronically. Number of Addenda: 0 Note Initiated On: 08/15/2015 1:06 PM      [facility]

## 2015-08-15 NOTE — Transfer of Care (Signed)
Immediate Anesthesia Transfer of Care Note  Patient: Bailey Dominguez  Procedure(s) Performed: Procedure(s): ESOPHAGOGASTRODUODENOSCOPY (EGD) WITH PROPOFOL (N/A)  Patient Location: PACU and Endoscopy Unit  Anesthesia Type:General  Level of Consciousness: awake, alert , oriented and patient cooperative  Airway & Oxygen Therapy: Patient Spontanous Breathing and Patient connected to nasal cannula oxygen  Post-op Assessment: Report given to RN and Post -op Vital signs reviewed and stable  Post vital signs: Reviewed and stable  Last Vitals:  Filed Vitals:   08/15/15 1228  BP: 146/67  Pulse: 63  Temp: 36.6 C  Resp: 18    Last Pain:  Filed Vitals:   08/15/15 1232  PainSc: 10-Worst pain ever         Complications: No apparent anesthesia complications

## 2015-08-16 LAB — SURGICAL PATHOLOGY

## 2015-08-20 ENCOUNTER — Encounter: Payer: Self-pay | Admitting: Gastroenterology

## 2015-08-21 NOTE — Anesthesia Postprocedure Evaluation (Signed)
Anesthesia Post Note  Patient: Bailey Dominguez  Procedure(s) Performed: Procedure(s) (LRB): ESOPHAGOGASTRODUODENOSCOPY (EGD) WITH PROPOFOL (N/A)  Patient location during evaluation: PACU Anesthesia Type: General Level of consciousness: awake and alert Pain management: pain level controlled Vital Signs Assessment: post-procedure vital signs reviewed and stable Respiratory status: spontaneous breathing, nonlabored ventilation, respiratory function stable and patient connected to nasal cannula oxygen Cardiovascular status: blood pressure returned to baseline and stable Postop Assessment: no signs of nausea or vomiting Anesthetic complications: no    Last Vitals:  Filed Vitals:   08/15/15 1342 08/15/15 1352  BP: 107/79 113/84  Pulse: 58   Temp:    Resp: 14 21    Last Pain:  Filed Vitals:   08/16/15 0756  PainSc: 0-No pain                 Molli Barrows

## 2015-09-03 ENCOUNTER — Encounter: Payer: Self-pay | Admitting: Emergency Medicine

## 2015-09-03 ENCOUNTER — Emergency Department: Payer: Medicare Other

## 2015-09-03 ENCOUNTER — Emergency Department
Admission: EM | Admit: 2015-09-03 | Discharge: 2015-09-03 | Disposition: A | Discharge status: Home / Self Care | Payer: Medicare Other | Attending: Emergency Medicine | Admitting: Emergency Medicine

## 2015-09-03 DIAGNOSIS — M25531 Pain in right wrist: Secondary | ICD-10-CM | POA: Diagnosis present

## 2015-09-03 DIAGNOSIS — J449 Chronic obstructive pulmonary disease, unspecified: Secondary | ICD-10-CM | POA: Insufficient documentation

## 2015-09-03 DIAGNOSIS — I10 Essential (primary) hypertension: Secondary | ICD-10-CM | POA: Insufficient documentation

## 2015-09-03 DIAGNOSIS — M67431 Ganglion, right wrist: Secondary | ICD-10-CM | POA: Insufficient documentation

## 2015-09-03 DIAGNOSIS — F17200 Nicotine dependence, unspecified, uncomplicated: Secondary | ICD-10-CM | POA: Insufficient documentation

## 2015-09-03 MED ORDER — ACETAMINOPHEN ER 650 MG PO TBCR
650.0000 mg | EXTENDED_RELEASE_TABLET | Freq: Three times a day (TID) | ORAL | 0 refills | Status: AC | PRN
Start: 2015-09-03 — End: 2015-09-10

## 2015-09-03 NOTE — ED Provider Notes (Signed)
[facility] Emergency Department Provider Note  ____________________________________________  Time seen: Approximately 10:09 AM  I have reviewed the triage vital signs and the nursing notes.   HISTORY  Chief Complaint Wrist Pain    HPI Bailey Dominguez is a 60 y.o. female , NAD, presents to the emergency department with several hour history of right wrist pain and swelling. Patient states she woke this morning with significant pain as well as a swollen area about her right wrist. States approximately 20 years ago she had the same thing happened and had a fractured bone. Does note that she has osteoporosis and arthritis in which is being treated by her primary care provider. Denies any falls, injuries or traumas. Has not noted any open wounds or other skin sores about the hand. Denies any numbness, weakness, tingling. Has not had any fevers, chills, body aches. Has had full range of motion of the right hand and wrist but notes pain with gripping.   Past Medical History:  Diagnosis Date  . Allergic rhinitis   . B12 deficiency   . Chickenpox   . Connective tissue disease ([place])   . COPD (chronic obstructive pulmonary disease) ([place])   . Depression   . Dysphagia   . GERD (gastroesophageal reflux disease)   . H/O degenerative disc disease   . Hypertension   . Nocturnal muscle cramps   . OSA on CPAP   . Osteoarthritis   . 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 Vitamin D deficiency     There are no active problems to display for this patient.   Past Surgical History:  Procedure Laterality Date  . ABDOMINAL HYSTERECTOMY    . ESOPHAGOGASTRODUODENOSCOPY (EGD) WITH PROPOFOL N/A 08/15/2015   Procedure: ESOPHAGOGASTRODUODENOSCOPY (EGD) WITH PROPOFOL;  Surgeon: Lollie Sails, MD;  Location[facility] ENDOSCOPY;  Service: Endoscopy;  Laterality: N/A;  . REPLACEMENT TOTAL KNEE Right   . ROTATOR CUFF REPAIR Left   . ROTATOR CUFF REPAIR Right     Prior to  Admission medications   Medication Sig Start Date End Date Taking? Authorizing Provider  acetaminophen (TYLENOL) 650 MG CR tablet Take 1 tablet (650 mg total) by mouth every 8 (eight) hours as needed for pain. 09/03/15 09/10/15  Jami L Hagler, PA-C  albuterol (PROVENTIL HFA;VENTOLIN HFA) 108 (90 Base) MCG/ACT inhaler Inhale 2 puffs into the lungs every 6 (six) hours as needed for wheezing or shortness of breath.    Historical Provider, MD  bisoprolol (ZEBETA) 5 MG tablet Take 5 mg by mouth daily.    Historical Provider, MD  celecoxib (CELEBREX) 200 MG capsule Take 200 mg by mouth 2 (two) times daily.    Historical Provider, MD  clopidogrel (PLAVIX) 75 MG tablet Take 75 mg by mouth daily.    Historical Provider, MD  cyclobenzaprine (FLEXERIL) 10 MG tablet Take 10 mg by mouth 2 (two) times daily as needed for muscle spasms.    Historical Provider, MD  diazepam (VALIUM) 5 MG tablet Take 5 mg by mouth.    Historical Provider, MD  gabapentin (NEURONTIN) 300 MG capsule Take 300 mg by mouth at bedtime.    Historical Provider, MD  hydrochlorothiazide (HYDRODIURIL) 25 MG tablet Take 25 mg by mouth daily.    Historical Provider, MD  HYDROcodone-acetaminophen (NORCO/VICODIN) 5-325 MG tablet Take 1 tablet by mouth 2 (two) times daily as needed for moderate pain.    Historical Provider, MD  hydroxychloroquine (PLAQUENIL) 200 MG tablet Take 400 mg by mouth daily.  Historical Provider, MD  pantoprazole (PROTONIX) 40 MG tablet Take 40 mg by mouth daily.    Historical Provider, MD  pravastatin (PRAVACHOL) 40 MG tablet Take 40 mg by mouth at bedtime.    Historical Provider, MD  senna (SENOKOT) 8.6 MG tablet Take 2 tablets by mouth daily as needed for constipation.    Historical Provider, MD  spironolactone (ALDACTONE) 25 MG tablet Take 25 mg by mouth daily.    Historical Provider, MD  tiZANidine (ZANAFLEX) 4 MG capsule Take 4 mg by mouth every 8 (eight) hours as needed for muscle spasms.    Historical Provider, MD   vitamin B-12 (CYANOCOBALAMIN) 500 MCG tablet Take 500 mcg by mouth daily.    Historical Provider, MD  zolpidem (AMBIEN) 10 MG tablet Take 5 mg by mouth at bedtime as needed for sleep.    Historical Provider, MD    Allergies Codeine  No family history on file.  Social History Social History  Substance Use Topics  . Smoking status: Current Every Day Smoker  . Smokeless tobacco: Never Used  . Alcohol use No     Review of Systems Constitutional: No fever/chills Cardiovascular: No chest pain. Respiratory: No shortness of breath.  Musculoskeletal: Positive right wrist pain. Negative for right shoulder, elbow, hand, finger pain.   Skin: Positive swelling right wrist. Negative for rash, redness, skin sores, oozing, weeping. Neurological: Negative for headaches, focal weakness or numbness. No tingling 10-point ROS otherwise negative.  ____________________________________________   PHYSICAL EXAM:  VITAL SIGNS: ED Triage Vitals [09/03/15 1004]  Enc Vitals Group     BP (!) 147/64     Pulse Rate 70     Resp 17     Temp 98.7 F (37.1 C)     Temp Source Oral     SpO2 96 %     Weight 172 lb (78 kg)     Height 5\' 1"  (1.549 m)     Head Circumference      Peak Flow      Pain Score      Pain Loc      Pain Edu?      Excl. in [place]?      Constitutional: Alert and oriented. Well appearing and in no acute distress. Eyes: Conjunctivae are normalWithout icterus or injection Head: Atraumatic. Neck: Supple with full range of motion Hematological/Lymphatic/Immunilogical: No cervical lymphadenopathy. Cardiovascular: Good peripheral circulation with 2+ pulses noted in bilateral upper extremities. Capillary refill is brisk in the right upper extremity. Respiratory: Normal respiratory effort without tachypnea or retractions.  Musculoskeletal: Full range of motion of the right wrist but with pain with full flexion and extension. No pain with pronation or supination. Grip strength of  bilateral hands is 5 out of 5. No lower extremity tenderness nor edema.  No joint effusions. Neurologic:  Normal speech and language. No gross focal neurologic deficits are appreciated. Gait and posture are normal Skin:  1 cm annular cyst like structure palpated about the medial portion of the dorsal wrist with tenderness to palpation. Mild warmth but no redness. No oozing or weeping. No fluctuance or induration. No open skin sores nor active oozing or weeping. Skin is warm, dry and intact. No rash noted. Psychiatric: Mood and affect are normal. Speech and behavior are normal. Patient exhibits appropriate insight and judgement.   ____________________________________________   LABS  None ____________________________________________  EKG  None ____________________________________________  RADIOLOGY I have personally viewed and evaluated these images (plain radiographs) as part of my medical decision  making, as well as reviewing the written report by the radiologist.  Dg Wrist Complete Right  Result Date: 09/03/2015 CLINICAL DATA:  Pain, swelling.  No known injury. EXAM: RIGHT WRIST - COMPLETE 3+ VIEW COMPARISON:  None. FINDINGS: There is no evidence of fracture or dislocation. There is no evidence of arthropathy or other focal bone abnormality. Soft tissues are unremarkable. IMPRESSION: Negative. Electronically Signed   By: Rolm Baptise M.D.   On: 09/03/2015 10:38   ____________________________________________    PROCEDURES  Procedure(s) performed: None   Procedures   Medications - No data to display   ____________________________________________   INITIAL IMPRESSION / ASSESSMENT AND PLAN / ED COURSE  Pertinent labs & imaging results that were available during my care of the patient were reviewed by me and considered in my medical decision making (see chart for details).  Clinical Course    Patient's diagnosis is consistent with Ganglion cyst of right wrist. Patient  was placed in a prefabricated right wrist splint which gave him immediate relief. Patient will be discharged home with prescriptions for arthritis strength Tylenol to use as needed for pain. Patient is to follow up with Dr. Marry Guan in orthopedics if symptoms persist past this treatment course. Patient is given ED precautions to return to the ED for any worsening or new symptoms.    ____________________________________________  FINAL CLINICAL IMPRESSION(S) / ED DIAGNOSES  Final diagnoses:  Ganglion cyst of wrist, right      NEW MEDICATIONS STARTED DURING THIS VISIT:  New Prescriptions   ACETAMINOPHEN (TYLENOL) 650 MG CR TABLET    Take 1 tablet (650 mg total) by mouth every 8 (eight) hours as needed for pain.         Braxton Feathers, PA-C 09/03/15 [place], MD 09/03/15 [phone]

## 2015-09-03 NOTE — ED Triage Notes (Signed)
Developed some pain with swelling to right wrist w/o injury

## 2015-11-14 ENCOUNTER — Telehealth: Payer: Self-pay | Admitting: *Deleted

## 2015-11-14 ENCOUNTER — Encounter: Payer: Self-pay | Admitting: Urology

## 2015-11-14 ENCOUNTER — Ambulatory Visit (INDEPENDENT_AMBULATORY_CARE_PROVIDER_SITE_OTHER): Payer: Medicare Other | Admitting: Urology

## 2015-11-14 VITALS — BP 127/53 | HR 55 | Ht 61.0 in | Wt 178.1 lb

## 2015-11-14 DIAGNOSIS — R102 Pelvic and perineal pain: Secondary | ICD-10-CM | POA: Diagnosis not present

## 2015-11-14 DIAGNOSIS — R350 Frequency of micturition: Secondary | ICD-10-CM

## 2015-11-14 DIAGNOSIS — N3941 Urge incontinence: Secondary | ICD-10-CM

## 2015-11-14 LAB — BLADDER SCAN AMB NON-IMAGING: Scan Result: 0

## 2015-11-14 NOTE — Telephone Encounter (Signed)
Spoke with patient and started [facility] appointments for October 25th Wednesdays at 9:30 am. Patient ok with the plan.

## 2015-11-14 NOTE — Progress Notes (Signed)
11/14/2015 9:55 AM   Bailey Dominguez Dec 27, 1955 [id number]  Referring provider: Ezequiel Kayser, MD [facility] [place], [place] [address]  Chief Complaint  Patient presents with  . Urinary Frequency    New Patient referred by Dr. Dorthula Perfect    HPI: Patient is a 60 -year-old [name] who is referred to [place] by, Dr. Dorthula Perfect, for urinary frequency and incontinence.  Patient states that she has had these symptoms for 3 to 4 years.  She has not seen anybody for this condition in the past.  Patient has incontinence with coughing, laughing, sneezing, etc.    She is also having incontinence when she feels the urge to urinate and cannot get to the bathroom on time.  She is experiencing 3 to 4 incontinent episodes during the day. She is not experiencing  incontinent episodes during the night.  Her incontinence volume is moderate.   She is wearing 3 pads/depends daily.    She is having associated urinary frequency, urgency, nocturia, intermittency, hesitancy, straining to urinate and weak urinary stream.    She does not have a history of urinary tract infections, STI's or injury to the bladder.  She denies dysuria, gross hematuria, suprapubic pain, back pain, abdominal pain or flank pain.  She has not had any recent fevers, chills, nausea or vomiting.   She does not have a history of nephrolithiasis, GU surgery or GU trauma.   She does not have a history of nephrolithiasis, GU surgery or GU trauma.   She is sexually active.  She has not noted incontinence with sexual intercourse.   She is post menopausal.   She admits to constipation.  She is not having pain with bladder filling.   She has not had any recent imaging studies.    She is drinking water all day.   She is drinking 1 caffeinated beverages daily.  She is not drinking alcoholic beverages.    Her risk factors for incontinence are obesity, age, smoking, caffeine, stroke, depression and pelvic surgery.      She is taking antihistamines, decongestants, benzo's, opioids, diuretics, antidepressants and skeletal muscle relaxants.     PMH: Past Medical History:  Diagnosis Date  . Allergic rhinitis   . ASCVD (arteriosclerotic cardiovascular disease) 04/14/2013   Overview:  A. CVA - 2008 B. Hyperlipidemia C. Cigarettes  . B12 deficiency   . Chickenpox   . Chronic kidney disease, stage III (moderate) 10/07/2013  . Connective tissue disease ([place])   . COPD (chronic obstructive pulmonary disease) ([place])   . COPD, mild ([place]) 10/07/2013  . Depression   . Dysphagia   . GERD (gastroesophageal reflux disease)   . H/O degenerative disc disease   . History of embolic stroke without residual deficits 01/05/2007  . Hypercholesterolemia 10/07/2013  . Hypertension   . Impingement syndrome, shoulder 10/07/2013  . Nocturnal muscle cramps   . Obstructive sleep apnea on CPAP 10/07/2013   Overview:  9 cm  . OSA on CPAP   . Osteoarthritis   . Primary osteoarthritis of right knee 08/23/2013  . Tear of medial cartilage or meniscus of knee, current 08/23/2013  . 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 Vitamin D deficiency     Surgical History: Past Surgical History:  Procedure Laterality Date  . ABDOMINAL HYSTERECTOMY    . ESOPHAGOGASTRODUODENOSCOPY (EGD) WITH PROPOFOL N/A 08/15/2015   Procedure:  ESOPHAGOGASTRODUODENOSCOPY (EGD) WITH PROPOFOL;  Surgeon: Lollie Sails, MD;  Location: [facility] ENDOSCOPY;  Service: Endoscopy;  Laterality: N/A;  . REPLACEMENT TOTAL KNEE Right   . ROTATOR CUFF REPAIR Left   . ROTATOR CUFF REPAIR Right     Home Medications:    Medication List       Accurate as of 11/14/15  9:55 AM. Always use your most recent med list.          acetaminophen 650 MG CR tablet Commonly known as:  TYLENOL Take by mouth.   albuterol 108 (90 Base) MCG/ACT inhaler Commonly known as:   PROVENTIL HFA;VENTOLIN HFA Inhale 2 puffs into the lungs every 6 (six) hours as needed for wheezing or shortness of breath.   amLODipine 10 MG tablet Commonly known as:  NORVASC Take by mouth.   bisoprolol 5 MG tablet Commonly known as:  ZEBETA Take 5 mg by mouth daily.   CALTRATE 600+D PO Take by mouth.   celecoxib 200 MG capsule Commonly known as:  CELEBREX Take 200 mg by mouth 2 (two) times daily.   clindamycin 1 % external solution Commonly known as:  CLEOCIN T Apply topically.   clopidogrel 75 MG tablet Commonly known as:  PLAVIX Take 75 mg by mouth daily.   cyclobenzaprine 10 MG tablet Commonly known as:  FLEXERIL Take 10 mg by mouth 2 (two) times daily as needed for muscle spasms.   diazepam 5 MG tablet Commonly known as:  VALIUM Take 5 mg by mouth.   doxycycline 100 MG tablet Commonly known as:  VIBRA-TABS Take by mouth.   gabapentin 300 MG capsule Commonly known as:  NEURONTIN Take 300 mg by mouth at bedtime.   hydrochlorothiazide 25 MG tablet Commonly known as:  HYDRODIURIL Take 25 mg by mouth daily.   HYDROcodone-acetaminophen 5-325 MG tablet Commonly known as:  NORCO/VICODIN Take 1 tablet by mouth 2 (two) times daily as needed for moderate pain.   hydroxychloroquine 200 MG tablet Commonly known as:  PLAQUENIL Take 400 mg by mouth daily.   magnesium oxide 400 MG tablet Commonly known as:  MAG-OX Take by mouth.   pantoprazole 40 MG tablet Commonly known as:  PROTONIX Take 40 mg by mouth daily.   pravastatin 40 MG tablet Commonly known as:  PRAVACHOL Take 40 mg by mouth at bedtime.   ranitidine 150 MG tablet Commonly known as:  ZANTAC Take by mouth.   senna 8.6 MG tablet Commonly known as:  SENOKOT Take 2 tablets by mouth daily as needed for constipation.   spironolactone 25 MG tablet Commonly known as:  ALDACTONE Take 25 mg by mouth daily.   sucralfate 1 g tablet Commonly known as:  CARAFATE Take by mouth.   tiZANidine 4 MG  capsule Commonly known as:  ZANAFLEX Take 4 mg by mouth every 8 (eight) hours as needed for muscle spasms.   vitamin B-12 500 MCG tablet Commonly known as:  CYANOCOBALAMIN Take 500 mcg by mouth daily.   Vitamin D (Ergocalciferol) 50000 units Caps capsule Commonly known as:  DRISDOL Take by mouth.   zolpidem 10 MG tablet Commonly known as:  AMBIEN Take 5 mg by mouth at bedtime as needed for sleep.       Allergies:  Allergies  Allergen Reactions  . Codeine     Family History: Family History  Problem Relation Age of Onset  . Kidney cancer Neg Hx   . Prostate cancer Neg Hx   . Bladder Cancer Neg Hx  Social History:  reports that she has been smoking.  She has never used smokeless tobacco. She reports that she does not drink alcohol or use drugs.  ROS: UROLOGY Frequent Urination?: Yes Hard to postpone urination?: Yes Burning/pain with urination?: No Get up at night to urinate?: Yes Leakage of urine?: Yes Urine stream starts and stops?: Yes Trouble starting stream?: Yes Do you have to strain to urinate?: Yes Blood in urine?: No Urinary tract infection?: No Sexually transmitted disease?: No Injury to kidneys or bladder?: No Painful intercourse?: No Weak stream?: Yes Currently pregnant?: No Vaginal bleeding?: No Last menstrual period?: n  Gastrointestinal Nausea?: No Vomiting?: No Indigestion/heartburn?: Yes Diarrhea?: No Constipation?: Yes  Constitutional Fever: No Night sweats?: Yes Weight loss?: No Fatigue?: Yes  Skin Skin rash/lesions?: No Itching?: Yes  Eyes Blurred vision?: No Double vision?: Yes  Ears/Nose/Throat Sore throat?: Yes Sinus problems?: No  Hematologic/Lymphatic Swollen glands?: No Easy bruising?: Yes  Cardiovascular Leg swelling?: Yes Chest pain?: Yes  Respiratory Cough?: No Shortness of breath?: Yes  Endocrine Excessive thirst?: Yes  Musculoskeletal Back pain?: Yes Joint pain?:  Yes  Neurological Headaches?: Yes Dizziness?: Yes  Psychologic Depression?: No Anxiety?: Yes  Physical Exam: BP (!) 127/53   Pulse (!) 55   Ht 5\' 1"  (1.549 m)   Wt 178 lb 1.6 oz (80.8 kg)   BMI 33.65 kg/m   Constitutional: Well nourished. Alert and oriented, No acute distress. HEENT: [place] AT, moist mucus membranes. Trachea midline, no masses. Cardiovascular: No clubbing, cyanosis, or edema. Respiratory: Normal respiratory effort, no increased work of breathing. GI: Abdomen is soft, non tender, non distended, no abdominal masses. Liver and spleen not palpable.  No hernias appreciated.  Stool sample for occult testing is not indicated.   GU: No CVA tenderness.  No bladder fullness or masses.  Normal external genitalia, normal pubic hair distribution, no lesions.  Normal urethral meatus, no lesions, no prolapse, no discharge.   No urethral masses, tenderness and/or tenderness. No bladder fullness, tenderness or masses. Normal vagina mucosa, good estrogen effect, no discharge, no lesions, good pelvic support, no cystocele or rectocele noted.  Uterus is absent.  Right pelvic tenderness is noted.   Anus and perineum are without rashes or lesions. Skin: No rashes, bruises or suspicious lesions. Lymph: No cervical or inguinal adenopathy. Neurologic: Grossly intact, no focal deficits, moving all 4 extremities. Psychiatric: Normal mood and affect.  Laboratory Data: Lab Results  Component Value Date   WBC 8.2 02/09/2014   HGB 9.6 (L) 02/24/2014   HCT 36.2 02/09/2014   MCV 90 02/09/2014   PLT 236 02/23/2014    Lab Results  Component Value Date   CREATININE 0.95 02/24/2014    Pertinent Imaging: Results for JOVANI, ENTWISTLE (MRN [id number]) as of 11/14/2015 16:27  Ref. Range 11/14/2015 09:21  Scan Result Unknown 0    Assessment & Plan:    1. Urinary frequency  - offered behavioral therapies, bladder, bladder control strategies, pelvic floor muscle training  - fluid management  discussed  - offered medical therapy with anticholinergic therapy or beta-3 adrenergic receptor agonist and the potential side effects of each therapy - patient not a candidate for either therapy due to stroke and hypertension  - offered PTNS therapy - patient is interested in this therapy  - BLADDER SCAN AMB NON-IMAGING  2. Urge incontinence  - see above  2. Right adnexal pain  - no mass appreciated on exam  - obtain pelvic ultrasound   Return for schedule PTNS.  These notes generated with voice recognition software. I apologize for typographical errors.  Zara Council, [facility] [address], [place], [place] [address] [phone]

## 2015-11-15 ENCOUNTER — Other Ambulatory Visit: Payer: Self-pay | Admitting: Urology

## 2015-11-15 DIAGNOSIS — R102 Pelvic and perineal pain: Secondary | ICD-10-CM

## 2015-11-20 ENCOUNTER — Other Ambulatory Visit: Payer: Self-pay

## 2015-11-20 DIAGNOSIS — R102 Pelvic and perineal pain: Secondary | ICD-10-CM

## 2015-11-22 ENCOUNTER — Ambulatory Visit
Admission: RE | Admit: 2015-11-22 | Discharge: 2015-11-22 | Disposition: A | Discharge status: Home / Self Care | Payer: Medicare Other | Source: Ambulatory Visit | Attending: Internal Medicine | Admitting: Internal Medicine

## 2015-11-22 DIAGNOSIS — Z1231 Encounter for screening mammogram for malignant neoplasm of breast: Secondary | ICD-10-CM | POA: Diagnosis not present

## 2015-11-23 ENCOUNTER — Ambulatory Visit
Admission: RE | Admit: 2015-11-23 | Discharge: 2015-11-23 | Disposition: A | Discharge status: Home / Self Care | Payer: Medicare Other | Source: Ambulatory Visit | Attending: Urology | Admitting: Urology

## 2015-11-23 DIAGNOSIS — Z90710 Acquired absence of both cervix and uterus: Secondary | ICD-10-CM | POA: Insufficient documentation

## 2015-11-23 DIAGNOSIS — R102 Pelvic and perineal pain: Secondary | ICD-10-CM | POA: Diagnosis present

## 2015-11-29 ENCOUNTER — Encounter: Payer: Self-pay | Admitting: Urology

## 2015-11-29 ENCOUNTER — Ambulatory Visit (INDEPENDENT_AMBULATORY_CARE_PROVIDER_SITE_OTHER): Payer: Medicare Other | Admitting: Urology

## 2015-11-29 VITALS — BP 113/68 | HR 52 | Ht 61.0 in | Wt 180.2 lb

## 2015-11-29 DIAGNOSIS — N3941 Urge incontinence: Secondary | ICD-10-CM

## 2015-11-29 DIAGNOSIS — R102 Pelvic and perineal pain: Secondary | ICD-10-CM

## 2015-11-29 DIAGNOSIS — R350 Frequency of micturition: Secondary | ICD-10-CM | POA: Diagnosis not present

## 2015-11-29 LAB — PTNS-PERCUTANEOUS TIBIAL NERVE STIMULATION: Scan Result: 4

## 2015-11-29 NOTE — Progress Notes (Signed)
11/29/2015 5:06 PM   Bailey Dominguez August 29, 1955 [id number]  Referring provider: Ezequiel Kayser, MD [facility] [place], [place] [address]  Chief Complaint  Patient presents with  . Urinary Frequency    PTNS    HPI: Patient is a 60 -year-old [name] who presents today for her first of twelve PTNS treatments.    Background history Patient was referred to [place] by, Dr. Dorthula Perfect, for urinary frequency and incontinence.  Patient states that she has had these symptoms for 3 to 4 years.  She has not seen anybody for this condition in the past.  Patient has incontinence with coughing, laughing, sneezing, etc.    She is also having incontinence when she feels the urge to urinate and cannot get to the bathroom on time.  She is experiencing 3 to 4 incontinent episodes during the day. She is not experiencing  incontinent episodes during the night.  Her incontinence volume is moderate.   She is wearing 3 pads/depends daily.  She is having associated urinary frequency, urgency, nocturia, intermittency, hesitancy, straining to urinate and weak urinary stream.   She does not have a history of urinary tract infections, STI's or injury to the bladder.  She denies dysuria, gross hematuria, suprapubic pain, back pain, abdominal pain or flank pain.  She has not had any recent fevers, chills, nausea or vomiting.   She does not have a history of nephrolithiasis, GU surgery or GU trauma.   She does not have a history of nephrolithiasis, GU surgery or GU trauma.  She is sexually active.  She has not noted incontinence with sexual intercourse.  She is post menopausal.   She admits to constipation.  She is not having pain with bladder filling.   She has not had any recent imaging studies.  She is drinking water all day.   She is drinking 1 caffeinated beverages daily.  She is not drinking alcoholic beverages.  Her risk factors for incontinence are obesity, age, smoking, caffeine, stroke,  depression and pelvic surgery.  She is taking antihistamines, decongestants, benzo's, opioids, diuretics, antidepressants and skeletal muscle relaxants.     She was found to have pain in the vincinity of her right adnexal area during her exam at her last visit.  Her pelvic ultrasound performed on 11/23/2015 noted status post hysterectomy. Neither ovary visualized on transabdominal or endovaginal scanning.  No intraperitoneal free fluid.   Patient was not a candidate for OAB medications due to her HTN or stroke.    Contraindications present for PTNS      Pacemaker- NO      Implantable defibrillator- NO      History of abnormal bleeding- NO      History of neuropathies or nerve damage- NO  Discussed with patient possible complications of procedure, such as discomfort, bleeding at insertion/stimulation site, procedure consent signed  Patient goals: To reduce her urinary frequency and night time voids.    PMH: Past Medical History:  Diagnosis Date  . Allergic rhinitis   . ASCVD (arteriosclerotic cardiovascular disease) 04/14/2013   Overview:  A. CVA - 2008 B. Hyperlipidemia C. Cigarettes  . B12 deficiency   . Chickenpox   . Chronic kidney disease, stage III (moderate) 10/07/2013  . Connective tissue disease ([place])   . COPD (chronic obstructive pulmonary disease) ([place])   . COPD, mild ([place]) 10/07/2013  . Depression   . Dysphagia   . GERD (gastroesophageal reflux disease)   . H/O degenerative  disc disease   . History of embolic stroke without residual deficits 01/05/2007  . Hypercholesterolemia 10/07/2013  . Hypertension   . Impingement syndrome, shoulder 10/07/2013  . Nocturnal muscle cramps   . Obstructive sleep apnea on CPAP 10/07/2013   Overview:  9 cm  . OSA on CPAP   . Osteoarthritis   . Primary osteoarthritis of right knee 08/23/2013  . Tear of medial cartilage or meniscus of knee, current 08/23/2013  . 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 TTP (thrombotic thrombocytopenic  purpura) ([place]) 10/07/2013   Overview:  S/P plasmapheresis 04/2004 at [facility]  . Vitamin D deficiency     Surgical History: Past Surgical History:  Procedure Laterality Date  . ABDOMINAL HYSTERECTOMY    . ESOPHAGOGASTRODUODENOSCOPY (EGD) WITH PROPOFOL N/A 08/15/2015   Procedure: ESOPHAGOGASTRODUODENOSCOPY (EGD) WITH PROPOFOL;  Surgeon: Lollie Sails, MD;  Location: [facility] ENDOSCOPY;  Service: Endoscopy;  Laterality: N/A;  . REPLACEMENT TOTAL KNEE Right   . ROTATOR CUFF REPAIR Left   . ROTATOR CUFF REPAIR Right     Home Medications:    Medication List       Accurate as of 11/29/15  5:06 PM. Always use your most recent med list.          acetaminophen 650 MG CR tablet Commonly known as:  TYLENOL Take by mouth.   albuterol 108 (90 Base) MCG/ACT inhaler Commonly known as:  PROVENTIL HFA;VENTOLIN HFA Inhale 2 puffs into the lungs every 6 (six) hours as needed for wheezing or shortness of breath.   amLODipine 10 MG tablet Commonly known as:  NORVASC Take by mouth.   bisoprolol 5 MG tablet Commonly known as:  ZEBETA Take 5 mg by mouth daily.   CALTRATE 600+D PO Take by mouth.   celecoxib 200 MG capsule Commonly known as:  CELEBREX Take 200 mg by mouth 2 (two) times daily.   clindamycin 1 % external solution Commonly known as:  CLEOCIN T Apply topically.   clopidogrel 75 MG tablet Commonly known as:  PLAVIX Take 75 mg by mouth daily.   cyclobenzaprine 10 MG tablet Commonly known as:  FLEXERIL Take 10 mg by mouth 2 (two) times daily as needed for muscle spasms.   diazepam 5 MG tablet Commonly known as:  VALIUM Take 5 mg by mouth.   doxycycline 100 MG tablet Commonly known as:  VIBRA-TABS Take by mouth.   gabapentin 300 MG capsule Commonly known as:  NEURONTIN Take 300 mg by mouth at bedtime.   hydrochlorothiazide 25 MG tablet Commonly known as:  HYDRODIURIL Take 25 mg by mouth daily.   HYDROcodone-acetaminophen 5-325 MG tablet Commonly known as:   NORCO/VICODIN Take 1 tablet by mouth 2 (two) times daily as needed for moderate pain.   hydroxychloroquine 200 MG tablet Commonly known as:  PLAQUENIL Take 400 mg by mouth daily.   magnesium oxide 400 MG tablet Commonly known as:  MAG-OX Take by mouth.   pantoprazole 40 MG tablet Commonly known as:  PROTONIX Take 40 mg by mouth daily.   pravastatin 40 MG tablet Commonly known as:  PRAVACHOL Take 40 mg by mouth at bedtime.   ranitidine 150 MG tablet Commonly known as:  ZANTAC Take by mouth.   senna 8.6 MG tablet Commonly known as:  SENOKOT Take 2 tablets by mouth daily as needed for constipation.   spironolactone 25 MG tablet Commonly known as:  ALDACTONE Take 25 mg by mouth daily.   sucralfate 1 g tablet Commonly known as:  CARAFATE  Take by mouth.   tiZANidine 4 MG capsule Commonly known as:  ZANAFLEX Take 4 mg by mouth every 8 (eight) hours as needed for muscle spasms.   vitamin B-12 500 MCG tablet Commonly known as:  CYANOCOBALAMIN Take 500 mcg by mouth daily.   Vitamin D (Ergocalciferol) 50000 units Caps capsule Commonly known as:  DRISDOL Take by mouth.   zolpidem 10 MG tablet Commonly known as:  AMBIEN Take 5 mg by mouth at bedtime as needed for sleep.       Allergies:  Allergies  Allergen Reactions  . Codeine     Family History: Family History  Problem Relation Age of Onset  . Kidney cancer Neg Hx   . Prostate cancer Neg Hx   . Bladder Cancer Neg Hx     Social History:  reports that she has been smoking.  She has never used smokeless tobacco. She reports that she does not drink alcohol or use drugs.  ROS: UROLOGY Frequent Urination?: Yes Hard to postpone urination?: No Burning/pain with urination?: No Get up at night to urinate?: Yes Leakage of urine?: Yes Urine stream starts and stops?: Yes Trouble starting stream?: No Do you have to strain to urinate?: Yes Blood in urine?: No Urinary tract infection?: No Sexually transmitted  disease?: No Injury to kidneys or bladder?: No Painful intercourse?: No Weak stream?: Yes Currently pregnant?: No Vaginal bleeding?: No Last menstrual period?: n  Gastrointestinal Nausea?: No Vomiting?: No Indigestion/heartburn?: Yes Diarrhea?: No Constipation?: No  Constitutional Fever: No Night sweats?: Yes Weight loss?: No Fatigue?: Yes  Skin Skin rash/lesions?: No Itching?: No  Eyes Blurred vision?: Yes Double vision?: No  Ears/Nose/Throat Sore throat?: No Sinus problems?: No  Hematologic/Lymphatic Swollen glands?: No Easy bruising?: Yes  Cardiovascular Leg swelling?: Yes Chest pain?: No  Respiratory Cough?: No Shortness of breath?: Yes  Endocrine Excessive thirst?: Yes  Musculoskeletal Back pain?: Yes Joint pain?: Yes  Neurological Headaches?: No Dizziness?: Yes  Psychologic Depression?: No Anxiety?: No  Physical Exam: BP 113/68   Pulse (!) 52   Ht 5\' 1"  (1.549 m)   Wt 180 lb 3.2 oz (81.7 kg)   BMI 34.05 kg/m   Constitutional: Well nourished. Alert and oriented, No acute distress. HEENT: [place] AT, moist mucus membranes. Trachea midline, no masses. Cardiovascular: No clubbing, cyanosis, or edema. Respiratory: Normal respiratory effort, no increased work of breathing. Skin: No rashes, bruises or suspicious lesions. Lymph: No cervical or inguinal adenopathy. Neurologic: Grossly intact, no focal deficits, moving all 4 extremities. Psychiatric: Normal mood and affect.  Laboratory Data: Lab Results  Component Value Date   WBC 8.2 02/09/2014   HGB 9.6 (L) 02/24/2014   HCT 36.2 02/09/2014   MCV 90 02/09/2014   PLT 236 02/23/2014    Lab Results  Component Value Date   CREATININE 0.95 02/24/2014    Pertinent Imaging: Results for TALEA, WIRTANEN (MRN [id number]) as of 11/14/2015 16:27  Ref. Range 11/14/2015 09:21  Scan Result Unknown 0   PTNS treatment: The needle electrode was inserted into the lower, inner aspect of the  patient's left leg. The surface electrode was placed on the inside arch of the foot on the treatment leg. The lead set was connected to the stimulator and the needle electrode clip was connected to the needle electrode. The stimulator that produces an adjustable electrical pulse that travels to the sacral nerve plexus via the tibial nerve was increased to 4 and tell the patient received a sensory response  1. Urinary frequency  Treatment Plan:    The needle electrode was removed without difficulty to the patient.  Patient tolerated the procedure for 30 minutes.  She will   return next week for # 2 out of 12 of their weekly PTNS treatment's  2. Urge incontinence  - see above  3. Right adnexal pain  - no mass appreciated on exam  - obtain pelvic ultrasound- normal   Return in about 1 week (around 12/06/2015) for # 2 PTNS.  These notes generated with voice recognition software. I apologize for typographical errors.  Zara Council, [facility] [address] [place], [place] [address] [phone]

## 2015-12-06 ENCOUNTER — Ambulatory Visit: Payer: Medicare Other | Admitting: Urology

## 2015-12-13 ENCOUNTER — Ambulatory Visit (INDEPENDENT_AMBULATORY_CARE_PROVIDER_SITE_OTHER): Payer: Medicare Other | Admitting: Urology

## 2015-12-13 ENCOUNTER — Encounter: Payer: Self-pay | Admitting: Urology

## 2015-12-13 VITALS — BP 119/64 | HR 65 | Ht 61.0 in | Wt 177.9 lb

## 2015-12-13 DIAGNOSIS — N3941 Urge incontinence: Secondary | ICD-10-CM

## 2015-12-13 DIAGNOSIS — R350 Frequency of micturition: Secondary | ICD-10-CM

## 2015-12-13 LAB — PTNS-PERCUTANEOUS TIBIAL NERVE STIMULATION: Scan Result: 4

## 2015-12-13 NOTE — Progress Notes (Signed)
12/13/2015 1:49 PM   Bailey Dominguez October 23, 1955 [id number]  Referring provider: Ezequiel Kayser, MD [facility] [place], [place] [address]  Chief Complaint  Patient presents with  . Urinary Frequency    PTNS 2    HPI: Patient is a 60 -year-old [name] who presents today for her 2nd of twelve PTNS treatments.    Background history Patient was referred to [place] by, Dr. Dorthula Perfect, for urinary frequency and incontinence.  Patient states that she has had these symptoms for 3 to 4 years.  She has not seen anybody for this condition in the past.  Patient has incontinence with coughing, laughing, sneezing, etc.    She is also having incontinence when she feels the urge to urinate and cannot get to the bathroom on time.  She is experiencing 3 to 4 incontinent episodes during the day. She is not experiencing  incontinent episodes during the night.  Her incontinence volume is moderate.   She is wearing 3 pads/depends daily.  She is having associated urinary frequency, urgency, nocturia, intermittency, hesitancy, straining to urinate and weak urinary stream.   She does not have a history of urinary tract infections, STI's or injury to the bladder.  She denies dysuria, gross hematuria, suprapubic pain, back pain, abdominal pain or flank pain.  She has not had any recent fevers, chills, nausea or vomiting.   She does not have a history of nephrolithiasis, GU surgery or GU trauma.   She does not have a history of nephrolithiasis, GU surgery or GU trauma.  She is sexually active.  She has not noted incontinence with sexual intercourse.  She is post menopausal.   She admits to constipation.  She is not having pain with bladder filling.   She has not had any recent imaging studies.  She is drinking water all day.   She is drinking 1 caffeinated beverages daily.  She is not drinking alcoholic beverages.  Her risk factors for incontinence are obesity, age, smoking, caffeine, stroke,  depression and pelvic surgery.  She is taking antihistamines, decongestants, benzo's, opioids, diuretics, antidepressants and skeletal muscle relaxants.    Patient was not a candidate for OAB medications due to her HTN and stroke.    Contraindications present for PTNS      Pacemaker- NO      Implantable defibrillator- NO      History of abnormal bleeding- NO      History of neuropathies or nerve damage- NO  Discussed with patient possible complications of procedure, such as discomfort, bleeding at insertion/stimulation site, procedure consent signed  Today, she complains of frequency, urgency, nocturia, incontinence, intermittency, weak stream and straining to urinate.  Since her last visit, she has had 6 daytime voids, 3 night time voids, and 0 incontinence episodes.  She is not having any  Urgency.  She denies fevers, chills, nausea and vomiting.  She denies dysuria, gross hematuria and suprapubic pain.    Patient goals: To reduce her urinary frequency and night time voids.    PMH: Past Medical History:  Diagnosis Date  . Allergic rhinitis   . ASCVD (arteriosclerotic cardiovascular disease) 04/14/2013   Overview:  A. CVA - 2008 B. Hyperlipidemia C. Cigarettes  . B12 deficiency   . Chickenpox   . Chronic kidney disease, stage III (moderate) 10/07/2013  . Connective tissue disease ([place])   . COPD (chronic obstructive pulmonary disease) ([place])   . COPD, mild ([place]) 10/07/2013  . Depression   .  Dysphagia   . GERD (gastroesophageal reflux disease)   . H/O degenerative disc disease   . History of embolic stroke without residual deficits 01/05/2007  . Hypercholesterolemia 10/07/2013  . Hypertension   . Impingement syndrome, shoulder 10/07/2013  . Nocturnal muscle cramps   . Obstructive sleep apnea on CPAP 10/07/2013   Overview:  9 cm  . OSA on CPAP   . Osteoarthritis   . Primary osteoarthritis of right knee 08/23/2013  . Tear of medial cartilage or meniscus of knee, current 08/23/2013  .  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 Vitamin D deficiency     Surgical History: Past Surgical History:  Procedure Laterality Date  . ABDOMINAL HYSTERECTOMY    . ESOPHAGOGASTRODUODENOSCOPY (EGD) WITH PROPOFOL N/A 08/15/2015   Procedure: ESOPHAGOGASTRODUODENOSCOPY (EGD) WITH PROPOFOL;  Surgeon: Lollie Sails, MD;  Location: [facility] ENDOSCOPY;  Service: Endoscopy;  Laterality: N/A;  . REPLACEMENT TOTAL KNEE Right   . ROTATOR CUFF REPAIR Left   . ROTATOR CUFF REPAIR Right     Home Medications:    Medication List       Accurate as of 12/13/15  1:49 PM. Always use your most recent med list.          acetaminophen 650 MG CR tablet Commonly known as:  TYLENOL Take by mouth.   albuterol 108 (90 Base) MCG/ACT inhaler Commonly known as:  PROVENTIL HFA;VENTOLIN HFA Inhale 2 puffs into the lungs every 6 (six) hours as needed for wheezing or shortness of breath.   amLODipine 10 MG tablet Commonly known as:  NORVASC Take by mouth.   bisoprolol 5 MG tablet Commonly known as:  ZEBETA Take 5 mg by mouth daily.   CALTRATE 600+D PO Take by mouth.   celecoxib 200 MG capsule Commonly known as:  CELEBREX Take 200 mg by mouth 2 (two) times daily.   clindamycin 1 % external solution Commonly known as:  CLEOCIN T Apply topically.   clopidogrel 75 MG tablet Commonly known as:  PLAVIX Take 75 mg by mouth daily.   cyclobenzaprine 10 MG tablet Commonly known as:  FLEXERIL Take 10 mg by mouth 2 (two) times daily as needed for muscle spasms.   doxycycline 100 MG tablet Commonly known as:  VIBRA-TABS Take by mouth.   hydrochlorothiazide 25 MG tablet Commonly known as:  HYDRODIURIL Take 25 mg by mouth daily.   HYDROcodone-acetaminophen 5-325 MG tablet Commonly known as:  NORCO/VICODIN Take 1 tablet by mouth 2 (two) times daily as needed for moderate pain.     hydroxychloroquine 200 MG tablet Commonly known as:  PLAQUENIL Take 400 mg by mouth daily.   magnesium oxide 400 MG tablet Commonly known as:  MAG-OX Take by mouth.   pantoprazole 40 MG tablet Commonly known as:  PROTONIX Take 40 mg by mouth daily.   pravastatin 40 MG tablet Commonly known as:  PRAVACHOL Take 40 mg by mouth at bedtime.   ranitidine 150 MG tablet Commonly known as:  ZANTAC Take by mouth.   senna 8.6 MG tablet Commonly known as:  SENOKOT Take 2 tablets by mouth daily as needed for constipation.   spironolactone 25 MG tablet Commonly known as:  ALDACTONE Take 25 mg by mouth daily.   sucralfate 1 g tablet Commonly known as:  CARAFATE Take by mouth.   tiZANidine 4 MG capsule Commonly known as:  ZANAFLEX Take 4 mg by mouth every  8 (eight) hours as needed for muscle spasms.   vitamin B-12 500 MCG tablet Commonly known as:  CYANOCOBALAMIN Take 500 mcg by mouth daily.   Vitamin D (Ergocalciferol) 50000 units Caps capsule Commonly known as:  DRISDOL Take by mouth.   zolpidem 10 MG tablet Commonly known as:  AMBIEN Take 5 mg by mouth at bedtime as needed for sleep.       Allergies:  Allergies  Allergen Reactions  . Codeine     Family History: Family History  Problem Relation Age of Onset  . Kidney cancer Neg Hx   . Prostate cancer Neg Hx   . Bladder Cancer Neg Hx     Social History:  reports that she has been smoking.  She has never used smokeless tobacco. She reports that she does not drink alcohol or use drugs.  ROS: UROLOGY Frequent Urination?: Yes Hard to postpone urination?: Yes Burning/pain with urination?: No Get up at night to urinate?: Yes Leakage of urine?: Yes Urine stream starts and stops?: Yes Trouble starting stream?: No Do you have to strain to urinate?: Yes Blood in urine?: No Urinary tract infection?: No Sexually transmitted disease?: No Injury to kidneys or bladder?: No Painful intercourse?: No Weak stream?:  Yes Currently pregnant?: No Vaginal bleeding?: No Last menstrual period?: n  Gastrointestinal Nausea?: No Vomiting?: No Indigestion/heartburn?: No Diarrhea?: No Constipation?: No  Constitutional Fever: No Night sweats?: Yes Weight loss?: No Fatigue?: Yes  Skin Skin rash/lesions?: No Itching?: No  Eyes Blurred vision?: No Double vision?: No  Ears/Nose/Throat Sore throat?: No Sinus problems?: No  Hematologic/Lymphatic Swollen glands?: No Easy bruising?: Yes  Cardiovascular Leg swelling?: Yes Chest pain?: Yes  Respiratory Cough?: No Shortness of breath?: No  Endocrine Excessive thirst?: Yes  Musculoskeletal Back pain?: Yes Joint pain?: Yes  Neurological Headaches?: No Dizziness?: Yes  Psychologic Depression?: No Anxiety?: No  Physical Exam: BP 119/64   Pulse 65   Ht 5\' 1"  (1.549 m)   Wt 177 lb 14.4 oz (80.7 kg)   BMI 33.61 kg/m   Constitutional: Well nourished. Alert and oriented, No acute distress. HEENT: [place] AT, moist mucus membranes. Trachea midline, no masses. Cardiovascular: No clubbing, cyanosis, or edema. Respiratory: Normal respiratory effort, no increased work of breathing. Skin: No rashes, bruises or suspicious lesions. Lymph: No cervical or inguinal adenopathy. Neurologic: Grossly intact, no focal deficits, moving all 4 extremities. Psychiatric: Normal mood and affect.  Laboratory Data: Lab Results  Component Value Date   WBC 8.2 02/09/2014   HGB 9.6 (L) 02/24/2014   HCT 36.2 02/09/2014   MCV 90 02/09/2014   PLT 236 02/23/2014    Lab Results  Component Value Date   CREATININE 0.95 02/24/2014     PTNS treatment: The needle electrode was inserted into the lower, inner aspect of the patient's left leg. The surface electrode was placed on the inside arch of the foot on the treatment leg. The lead set was connected to the stimulator and the needle electrode clip was connected to the needle electrode. The stimulator that  produces an adjustable electrical pulse that travels to the sacral nerve plexus via the tibial nerve was increased to 4 untill the patient received a sensory response and toe flex.    1. Urinary frequency   Treatment Plan:    The needle electrode was removed without difficulty to the patient.  Patient tolerated the procedure for 30  minutes.  She will return next week for # 3 out of 12 of their weekly  PTNS treatment's  2. Urge incontinence  - see above  Return in about 1 week (around 12/20/2015) for  # 3 PTNS.  These notes generated with voice recognition software. I apologize for typographical errors.  Zara Council, [facility] [address] [place], [place] [address] [phone]

## 2015-12-20 ENCOUNTER — Ambulatory Visit (INDEPENDENT_AMBULATORY_CARE_PROVIDER_SITE_OTHER): Payer: Medicare Other | Admitting: Urology

## 2015-12-20 ENCOUNTER — Encounter: Payer: Self-pay | Admitting: Urology

## 2015-12-20 VITALS — BP 129/55 | HR 63 | Ht 61.0 in | Wt 176.4 lb

## 2015-12-20 DIAGNOSIS — N3941 Urge incontinence: Secondary | ICD-10-CM | POA: Diagnosis not present

## 2015-12-20 NOTE — Progress Notes (Signed)
12/20/2015 10:20 AM   Bailey Dominguez April 19, 1955 [id number]  Referring provider: Ezequiel Kayser, MD [facility] [place], [place] [address]  Chief Complaint  Patient presents with  . PTNS    HPI: Patient is a 60 -year-old [name] who presents today for her 3rd of twelve PTNS treatments.    Background history Patient was referred to [place] by, Dr. Dorthula Perfect, for urinary frequency and incontinence.  Patient states that she has had these symptoms for 3 to 4 years.  She has not seen anybody for this condition in the past.  Patient has incontinence with coughing, laughing, sneezing, etc.    She is also having incontinence when she feels the urge to urinate and cannot get to the bathroom on time.  She is experiencing 3 to 4 incontinent episodes during the day. She is not experiencing  incontinent episodes during the night.  Her incontinence volume is moderate.   She is wearing 3 pads/depends daily.  She is having associated urinary frequency, urgency, nocturia, intermittency, hesitancy, straining to urinate and weak urinary stream.   She does not have a history of urinary tract infections, STI's or injury to the bladder.  She denies dysuria, gross hematuria, suprapubic pain, back pain, abdominal pain or flank pain.  She has not had any recent fevers, chills, nausea or vomiting.   She does not have a history of nephrolithiasis, GU surgery or GU trauma.   She does not have a history of nephrolithiasis, GU surgery or GU trauma.  She is sexually active.  She has not noted incontinence with sexual intercourse.  She is post menopausal.   She admits to constipation.  She is not having pain with bladder filling.   She has not had any recent imaging studies.  She is drinking water all day.   She is drinking 1 caffeinated beverages daily.  She is not drinking alcoholic beverages.  Her risk factors for incontinence are obesity, age, smoking, caffeine, stroke, depression and pelvic  surgery.  She is taking antihistamines, decongestants, benzo's, opioids, diuretics, antidepressants and skeletal muscle relaxants.    Patient was not a candidate for OAB medications due to her HTN and stroke.    Contraindications present for PTNS      Pacemaker- NO      Implantable defibrillator- NO      History of abnormal bleeding- NO      History of neuropathies or nerve damage- NO  Discussed with patient possible complications of procedure, such as discomfort, bleeding at insertion/stimulation site, procedure consent signed  Today, she complains of nocturia, incontinence, intermittency, weak stream and straining to urinate.  Since her last visit, she has had 5 daytime voids (improvement), 2 night time voids (improvement), and 0 incontinence episodes (stable).  She is not having any urgency.  She denies fevers, chills, nausea and vomiting.  She denies dysuria, gross hematuria and suprapubic pain.    Patient goals: To reduce her urinary frequency and night time voids.    PMH: Past Medical History:  Diagnosis Date  . Allergic rhinitis   . ASCVD (arteriosclerotic cardiovascular disease) 04/14/2013   Overview:  A. CVA - 2008 B. Hyperlipidemia C. Cigarettes  . B12 deficiency   . Chickenpox   . Chronic kidney disease, stage III (moderate) 10/07/2013  . Connective tissue disease ([place])   . COPD (chronic obstructive pulmonary disease) ([place])   . COPD, mild ([place]) 10/07/2013  . Depression   . Dysphagia   .  GERD (gastroesophageal reflux disease)   . H/O degenerative disc disease   . History of embolic stroke without residual deficits 01/05/2007  . Hypercholesterolemia 10/07/2013  . Hypertension   . Impingement syndrome, shoulder 10/07/2013  . Nocturnal muscle cramps   . Obstructive sleep apnea on CPAP 10/07/2013   Overview:  9 cm  . OSA on CPAP   . Osteoarthritis   . Primary osteoarthritis of right knee 08/23/2013  . Tear of medial cartilage or meniscus of knee, current 08/23/2013  . Thrombotic  thrombocytopenic purpura ([place])   . 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 Vitamin D deficiency     Surgical History: Past Surgical History:  Procedure Laterality Date  . ABDOMINAL HYSTERECTOMY    . ESOPHAGOGASTRODUODENOSCOPY (EGD) WITH PROPOFOL N/A 08/15/2015   Procedure: ESOPHAGOGASTRODUODENOSCOPY (EGD) WITH PROPOFOL;  Surgeon: Lollie Sails, MD;  Location: [facility] ENDOSCOPY;  Service: Endoscopy;  Laterality: N/A;  . REPLACEMENT TOTAL KNEE Right   . ROTATOR CUFF REPAIR Left   . ROTATOR CUFF REPAIR Right     Home Medications:    Medication List       Accurate as of 12/20/15 10:20 AM. Always use your most recent med list.          acetaminophen 650 MG CR tablet Commonly known as:  TYLENOL Take by mouth.   albuterol 108 (90 Base) MCG/ACT inhaler Commonly known as:  PROVENTIL HFA;VENTOLIN HFA Inhale 2 puffs into the lungs every 6 (six) hours as needed for wheezing or shortness of breath.   amLODipine 10 MG tablet Commonly known as:  NORVASC Take by mouth.   bisoprolol 5 MG tablet Commonly known as:  ZEBETA Take 5 mg by mouth daily.   CALTRATE 600+D PO Take by mouth.   celecoxib 200 MG capsule Commonly known as:  CELEBREX Take 200 mg by mouth 2 (two) times daily.   clindamycin 1 % external solution Commonly known as:  CLEOCIN T Apply topically.   clopidogrel 75 MG tablet Commonly known as:  PLAVIX Take 75 mg by mouth daily.   cyclobenzaprine 10 MG tablet Commonly known as:  FLEXERIL Take 10 mg by mouth 2 (two) times daily as needed for muscle spasms.   doxycycline 100 MG tablet Commonly known as:  VIBRA-TABS Take by mouth.   hydrochlorothiazide 25 MG tablet Commonly known as:  HYDRODIURIL Take 25 mg by mouth daily.   HYDROcodone-acetaminophen 5-325 MG tablet Commonly known as:  NORCO/VICODIN Take 1 tablet by mouth 2 (two) times daily as needed for moderate pain.     hydroxychloroquine 200 MG tablet Commonly known as:  PLAQUENIL Take 400 mg by mouth daily.   magnesium oxide 400 MG tablet Commonly known as:  MAG-OX Take by mouth.   pantoprazole 40 MG tablet Commonly known as:  PROTONIX Take 40 mg by mouth daily.   pravastatin 40 MG tablet Commonly known as:  PRAVACHOL Take 40 mg by mouth at bedtime.   ranitidine 150 MG tablet Commonly known as:  ZANTAC Take by mouth.   senna 8.6 MG tablet Commonly known as:  SENOKOT Take 2 tablets by mouth daily as needed for constipation.   spironolactone 25 MG tablet Commonly known as:  ALDACTONE Take 25 mg by mouth daily.   sucralfate 1 g tablet Commonly known as:  CARAFATE Take by mouth.   tiZANidine 4 MG capsule Commonly known as:  ZANAFLEX Take 4 mg by mouth every 8 (eight) hours as needed  for muscle spasms.   vitamin B-12 500 MCG tablet Commonly known as:  CYANOCOBALAMIN Take 500 mcg by mouth daily.   Vitamin D (Ergocalciferol) 50000 units Caps capsule Commonly known as:  DRISDOL Take by mouth.   zolpidem 10 MG tablet Commonly known as:  AMBIEN Take 5 mg by mouth at bedtime as needed for sleep.       Allergies:  Allergies  Allergen Reactions  . Codeine     Family History: Family History  Problem Relation Age of Onset  . Kidney cancer Neg Hx   . Prostate cancer Neg Hx   . Bladder Cancer Neg Hx     Social History:  reports that she has been smoking.  She has never used smokeless tobacco. She reports that she does not drink alcohol or use drugs.  ROS: UROLOGY Frequent Urination?: No Hard to postpone urination?: No Burning/pain with urination?: No Get up at night to urinate?: Yes Leakage of urine?: Yes Urine stream starts and stops?: Yes Trouble starting stream?: No Do you have to strain to urinate?: Yes Blood in urine?: No Urinary tract infection?: No Sexually transmitted disease?: No Injury to kidneys or bladder?: No Painful intercourse?: No Weak stream?:  No Currently pregnant?: No Vaginal bleeding?: No Last menstrual period?: n  Gastrointestinal Nausea?: No Vomiting?: No Indigestion/heartburn?: No Diarrhea?: No Constipation?: No  Constitutional Fever: No Night sweats?: Yes Weight loss?: No Fatigue?: Yes  Skin Skin rash/lesions?: No Itching?: No  Eyes Blurred vision?: Yes Double vision?: No  Ears/Nose/Throat Sore throat?: No Sinus problems?: No  Hematologic/Lymphatic Swollen glands?: No Easy bruising?: Yes  Cardiovascular Leg swelling?: Yes Chest pain?: No  Respiratory Cough?: No Shortness of breath?: No  Endocrine Excessive thirst?: Yes  Musculoskeletal Back pain?: Yes Joint pain?: Yes  Neurological Headaches?: No Dizziness?: Yes  Psychologic Depression?: No Anxiety?: No  Physical Exam: BP (!) 129/55   Pulse 63   Ht 5\' 1"  (1.549 m)   Wt 176 lb 6.4 oz (80 kg)   BMI 33.33 kg/m   Constitutional: Well nourished. Alert and oriented, No acute distress. HEENT: [place] AT, moist mucus membranes. Trachea midline, no masses. Cardiovascular: No clubbing, cyanosis, or edema. Respiratory: Normal respiratory effort, no increased work of breathing. Skin: No rashes, bruises or suspicious lesions. Lymph: No cervical or inguinal adenopathy. Neurologic: Grossly intact, no focal deficits, moving all 4 extremities. Psychiatric: Normal mood and affect.  Laboratory Data: Lab Results  Component Value Date   WBC 8.2 02/09/2014   HGB 9.6 (L) 02/24/2014   HCT 36.2 02/09/2014   MCV 90 02/09/2014   PLT 236 02/23/2014    Lab Results  Component Value Date   CREATININE 0.95 02/24/2014     PTNS treatment: The needle electrode was inserted into the lower, inner aspect of the patient's left leg. The surface electrode was placed on the inside arch of the foot on the treatment leg. The lead set was connected to the stimulator and the needle electrode clip was connected to the needle electrode. The stimulator that  produces an adjustable electrical pulse that travels to the sacral nerve plexus via the tibial nerve was increased to 6 untill the patient received both a sensory response and toe flex.    1. Urinary frequency   Treatment Plan:    The needle electrode was removed without difficulty to the patient.  Patient tolerated the procedure for 30  minutes.  She will return next week for # 4 out of 12 of their weekly PTNS treatment's  2. Urge incontinence  - see above  Return in about 1 week (around 12/27/2015) for # 4 PTNS.  These notes generated with voice recognition software. I apologize for typographical errors.  Zara Council, [facility] [address], [place], [place] [address] [phone]

## 2015-12-20 NOTE — Progress Notes (Signed)
PTNS  Session # 3  Health & Social Factors: no change Caffeine: 1 Alcohol: 0 Daytime voids #per day: 5 Night-time voids #per night: 2 Urgency: none Incontinence Episodes #per day: 0 Ankle used: left Treatment Setting: 6 Feeling/ Response: both Comments: Patient tolerated well.  Preformed By: Zara Council, PA-C  Assistant: Elberta Leatherwood, CMA  Follow Up: 1 week

## 2015-12-27 ENCOUNTER — Ambulatory Visit (INDEPENDENT_AMBULATORY_CARE_PROVIDER_SITE_OTHER): Payer: Medicare Other | Admitting: Urology

## 2015-12-27 ENCOUNTER — Encounter: Payer: Self-pay | Admitting: Urology

## 2015-12-27 VITALS — BP 113/63 | HR 66 | Ht 61.0 in | Wt 175.1 lb

## 2015-12-27 DIAGNOSIS — R350 Frequency of micturition: Secondary | ICD-10-CM

## 2015-12-27 DIAGNOSIS — N3941 Urge incontinence: Secondary | ICD-10-CM | POA: Diagnosis not present

## 2015-12-27 LAB — PTNS-PERCUTANEOUS TIBIAL NERVE STIMULATION: Scan Result: 1

## 2015-12-27 NOTE — Progress Notes (Signed)
12/27/2015 9:35 AM   Bailey [place] 1955-11-14 [id number]  Referring provider: Ezequiel Kayser, MD [name] [place], [place] [id number]  Chief Complaint  Patient presents with  . Urinary Frequency    PTNS    HPI: Patient is a 60 -year-old [name] who presents today for her 4th of twelve PTNS treatments.    Background history Patient was referred to [place] by, Dr. Dorthula Perfect, for urinary frequency and incontinence.  Patient states that she has had these symptoms for 3 to 4 years.  She has not seen anybody for this condition in the past.  Patient has incontinence with coughing, laughing, sneezing, etc.    She is also having incontinence when she feels the urge to urinate and cannot get to the bathroom on time.  She is experiencing 3 to 4 incontinent episodes during the day. She is not experiencing  incontinent episodes during the night.  Her incontinence volume is moderate.   She is wearing 3 pads/depends daily.  She is having associated urinary frequency, urgency, nocturia, intermittency, hesitancy, straining to urinate and weak urinary stream.   She does not have a history of urinary tract infections, STI's or injury to the bladder.  She denies dysuria, gross hematuria, suprapubic pain, back pain, abdominal pain or flank pain.  She has not had any recent fevers, chills, nausea or vomiting.   She does not have a history of nephrolithiasis, GU surgery or GU trauma.   She does not have a history of nephrolithiasis, GU surgery or GU trauma.  She is sexually active.  She has not noted incontinence with sexual intercourse.  She is post menopausal.   She admits to constipation.  She is not having pain with bladder filling.   She has not had any recent imaging studies.  She is drinking water all day.   She is drinking 1 caffeinated beverages daily.  She is not drinking alcoholic beverages.  Her risk factors for incontinence are obesity, age, smoking, caffeine, stroke,  depression and pelvic surgery.  She is taking antihistamines, decongestants, benzo's, opioids, diuretics, antidepressants and skeletal muscle relaxants.    Patient was not a candidate for OAB medications due to her HTN and stroke.    Contraindications present for PTNS      Pacemaker- NO      Implantable defibrillator- NO      History of abnormal bleeding- NO      History of neuropathies or nerve damage- NO  Discussed with patient possible complications of procedure, such as discomfort, bleeding at insertion/stimulation site, procedure consent signed  Today, she complains of nocturia, intermittency, weak stream and straining to urinate.  Since her last visit, she has had 5 daytime voids (stable), 2 night time voids (stable), and 0 incontinence episodes (stable).  She is not having any urgency.  She denies fevers, chills, nausea and vomiting.  She denies dysuria, gross hematuria and suprapubic pain.    Patient goals: To reduce her urinary frequency and night time voids.    PMH: Past Medical History:  Diagnosis Date  . Allergic rhinitis   . ASCVD (arteriosclerotic cardiovascular disease) 04/14/2013   Overview:  A. CVA - 2008 B. Hyperlipidemia C. Cigarettes  . B12 deficiency   . Chickenpox   . Chronic kidney disease, stage III (moderate) 10/07/2013  . Connective tissue disease ([place])   . COPD (chronic obstructive pulmonary disease) ([place])   . COPD, mild ([place]) 10/07/2013  . Depression   .  Dysphagia   . GERD (gastroesophageal reflux disease)   . H/O degenerative disc disease   . History of embolic stroke without residual deficits 01/05/2007  . Hypercholesterolemia 10/07/2013  . Hypertension   . Impingement syndrome, shoulder 10/07/2013  . Nocturnal muscle cramps   . Obstructive sleep apnea on CPAP 10/07/2013   Overview:  9 cm  . OSA on CPAP   . Osteoarthritis   . Primary osteoarthritis of right knee 08/23/2013  . Tear of medial cartilage or meniscus of knee, current 08/23/2013  . 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 Vitamin D deficiency     Surgical History: Past Surgical History:  Procedure Laterality Date  . ABDOMINAL HYSTERECTOMY    . ESOPHAGOGASTRODUODENOSCOPY (EGD) WITH PROPOFOL N/A 08/15/2015   Procedure: ESOPHAGOGASTRODUODENOSCOPY (EGD) WITH PROPOFOL;  Surgeon: Lollie Sails, MD;  Location: [facility] ENDOSCOPY;  Service: Endoscopy;  Laterality: N/A;  . REPLACEMENT TOTAL KNEE Right   . ROTATOR CUFF REPAIR Left   . ROTATOR CUFF REPAIR Right     Home Medications:    Medication List       Accurate as of 12/27/15  9:35 AM. Always use your most recent med list.          acetaminophen 650 MG CR tablet Commonly known as:  TYLENOL Take by mouth.   albuterol 108 (90 Base) MCG/ACT inhaler Commonly known as:  PROVENTIL HFA;VENTOLIN HFA Inhale 2 puffs into the lungs every 6 (six) hours as needed for wheezing or shortness of breath.   amLODipine 10 MG tablet Commonly known as:  NORVASC Take by mouth.   bisoprolol 5 MG tablet Commonly known as:  ZEBETA Take 5 mg by mouth daily.   CALTRATE 600+D PO Take by mouth.   celecoxib 200 MG capsule Commonly known as:  CELEBREX Take 200 mg by mouth 2 (two) times daily.   clindamycin 1 % external solution Commonly known as:  CLEOCIN T Apply topically.   clopidogrel 75 MG tablet Commonly known as:  PLAVIX Take 75 mg by mouth daily.   cyclobenzaprine 10 MG tablet Commonly known as:  FLEXERIL Take 10 mg by mouth 2 (two) times daily as needed for muscle spasms.   doxycycline 100 MG tablet Commonly known as:  VIBRA-TABS Take by mouth.   hydrochlorothiazide 25 MG tablet Commonly known as:  HYDRODIURIL Take 25 mg by mouth daily.   HYDROcodone-acetaminophen 5-325 MG tablet Commonly known as:  NORCO/VICODIN Take 1 tablet by mouth 2 (two) times daily as needed for moderate pain.     hydroxychloroquine 200 MG tablet Commonly known as:  PLAQUENIL Take 400 mg by mouth daily.   magnesium oxide 400 MG tablet Commonly known as:  MAG-OX Take by mouth.   pantoprazole 40 MG tablet Commonly known as:  PROTONIX Take 40 mg by mouth daily.   pravastatin 40 MG tablet Commonly known as:  PRAVACHOL Take 40 mg by mouth at bedtime.   ranitidine 150 MG tablet Commonly known as:  ZANTAC Take by mouth.   senna 8.6 MG tablet Commonly known as:  SENOKOT Take 2 tablets by mouth daily as needed for constipation.   spironolactone 25 MG tablet Commonly known as:  ALDACTONE Take 25 mg by mouth daily.   sucralfate 1 g tablet Commonly known as:  CARAFATE Take by mouth.   tiZANidine 4 MG capsule Commonly known as:  ZANAFLEX Take 4 mg by mouth every  8 (eight) hours as needed for muscle spasms.   vitamin B-12 500 MCG tablet Commonly known as:  CYANOCOBALAMIN Take 500 mcg by mouth daily.   Vitamin D (Ergocalciferol) 50000 units Caps capsule Commonly known as:  DRISDOL Take by mouth.   zolpidem 10 MG tablet Commonly known as:  AMBIEN Take 5 mg by mouth at bedtime as needed for sleep.       Allergies:  Allergies  Allergen Reactions  . Codeine     Family History: Family History  Problem Relation Age of Onset  . Kidney cancer Neg Hx   . Prostate cancer Neg Hx   . Bladder Cancer Neg Hx     Social History:  reports that she has been smoking.  She has never used smokeless tobacco. She reports that she does not drink alcohol or use drugs.  ROS: UROLOGY Frequent Urination?: No Hard to postpone urination?: No Burning/pain with urination?: No Get up at night to urinate?: Yes Leakage of urine?: No Urine stream starts and stops?: Yes Trouble starting stream?: No Do you have to strain to urinate?: No Blood in urine?: No Urinary tract infection?: No Sexually transmitted disease?: No Injury to kidneys or bladder?: No Painful intercourse?: No Weak stream?:  Yes Currently pregnant?: No Vaginal bleeding?: No Last menstrual period?: n  Gastrointestinal Nausea?: No Vomiting?: No Indigestion/heartburn?: No Diarrhea?: No Constipation?: No  Constitutional Fever: No Night sweats?: No Weight loss?: No Fatigue?: No  Skin Skin rash/lesions?: No Itching?: No  Eyes Blurred vision?: No Double vision?: No  Ears/Nose/Throat Sore throat?: No Sinus problems?: No  Hematologic/Lymphatic Swollen glands?: No Easy bruising?: Yes  Cardiovascular Leg swelling?: Yes Chest pain?: No  Respiratory Cough?: Yes Shortness of breath?: No  Endocrine Excessive thirst?: No  Musculoskeletal Back pain?: Yes Joint pain?: Yes  Neurological Headaches?: No Dizziness?: Yes  Psychologic Depression?: No Anxiety?: No  Physical Exam: BP 113/63   Pulse 66   Ht 5\' 1"  (1.549 m)   Wt 175 lb 1.6 oz (79.4 kg)   BMI 33.08 kg/m    Constitutional: Well nourished. Alert and oriented, No acute distress. HEENT: [place] AT, moist mucus membranes. Trachea midline, no masses. Cardiovascular: No clubbing, cyanosis, or edema. Respiratory: Normal respiratory effort, no increased work of breathing. Skin: No rashes, bruises or suspicious lesions. Lymph: No cervical or inguinal adenopathy. Neurologic: Grossly intact, no focal deficits, moving all 4 extremities. Psychiatric: Normal mood and affect.  Laboratory Data: Lab Results  Component Value Date   WBC 8.2 02/09/2014   HGB 9.6 (L) 02/24/2014   HCT 36.2 02/09/2014   MCV 90 02/09/2014   PLT 236 02/23/2014    Lab Results  Component Value Date   CREATININE 0.95 02/24/2014     PTNS treatment: The needle electrode was inserted into the lower, inner aspect of the patient's left leg. The surface electrode was placed on the inside arch of the foot on the treatment leg. The lead set was connected to the stimulator and the needle electrode clip was connected to the needle electrode. The stimulator that  produces an adjustable electrical pulse that travels to the sacral nerve plexus via the tibial nerve was increased to 1 until  the patient received both a sensory response and toe flex.    1. Urinary frequency   Treatment Plan:    The needle electrode was removed without difficulty to the patient.  Patient tolerated the procedure for 30 minutes.  She will return next week for # 5 out of 12 of  their weekly PTNS treatment's  2. Urge incontinence  - see above  Return in about 1 week (around 01/03/2016) for # 5 PTNS.  These notes generated with voice recognition software. I apologize for typographical errors.  Zara Council, [facility] [address] [place], [place] [address] [phone]

## 2016-01-03 ENCOUNTER — Ambulatory Visit: Payer: Medicare Other | Admitting: Urology

## 2016-01-10 ENCOUNTER — Ambulatory Visit: Payer: Medicare Other | Admitting: Urology

## 2016-01-10 ENCOUNTER — Encounter: Payer: Self-pay | Admitting: Urology

## 2016-01-10 NOTE — Progress Notes (Deleted)
01/10/2016 8:28 AM   [place]iley [place] [birth year] [id number]  Referring provider: Ezequiel Kayser, MD [facility] [place], [place] [address]  No chief complaint on file.   HPI: Patient is a 60 -year-old [name] who presents today for her 5th of twelve PTNS treatments.    Background history Patient was referred to [place] by, Dr. Dorthula Perfect, for urinary frequency and incontinence.  Patient states that she has had these symptoms for 3 to 4 years.  She has not seen anybody for this condition in the past.  Patient has incontinence with coughing, laughing, sneezing, etc.    She is also having incontinence when she feels the urge to urinate and cannot get to the bathroom on time.  She is experiencing 3 to 4 incontinent episodes during the day. She is not experiencing  incontinent episodes during the night.  Her incontinence volume is moderate.   She is wearing 3 pads/depends daily.  She is having associated urinary frequency, urgency, nocturia, intermittency, hesitancy, straining to urinate and weak urinary stream.   She does not have a history of urinary tract infections, STI's or injury to the bladder.  She denies dysuria, gross hematuria, suprapubic pain, back pain, abdominal pain or flank pain.  She has not had any recent fevers, chills, nausea or vomiting.   She does not have a history of nephrolithiasis, GU surgery or GU trauma.   She does not have a history of nephrolithiasis, GU surgery or GU trauma.  She is sexually active.  She has not noted incontinence with sexual intercourse.  She is post menopausal.   She admits to constipation.  She is not having pain with bladder filling.   She has not had any recent imaging studies.  She is drinking water all day.   She is drinking 1 caffeinated beverages daily.  She is not drinking alcoholic beverages.  Her risk factors for incontinence are obesity, age, smoking, caffeine, stroke, depression and pelvic surgery.  She is taking  antihistamines, decongestants, benzo's, opioids, diuretics, antidepressants and skeletal muscle relaxants.    Patient was not a candidate for OAB medications due to her HTN and stroke.    Contraindications present for PTNS      Pacemaker- NO      Implantable defibrillator- NO      History of abnormal bleeding- NO      History of neuropathies or nerve damage- NO  Discussed with patient possible complications of procedure, such as discomfort, bleeding at insertion/stimulation site, procedure consent signed  Today, she complains of nocturia, intermittency, weak stream and straining to urinate.  Since her last visit, she has had 5 daytime voids (stable), 2 night time voids (stable), and 0 incontinence episodes (stable).  She is not having any urgency.  She denies fevers, chills, nausea and vomiting.  She denies dysuria, gross hematuria and suprapubic pain.    Patient goals: To reduce her urinary frequency and night time voids.    PMH: Past Medical History:  Diagnosis Date  . Allergic rhinitis   . ASCVD (arteriosclerotic cardiovascular disease) 04/14/2013   Overview:  A. CVA - 2008 B. Hyperlipidemia C. Cigarettes  . B12 deficiency   . Chickenpox   . Chronic kidney disease, stage III (moderate) 10/07/2013  . Connective tissue disease ([place])   . COPD (chronic obstructive pulmonary disease) ([place])   . COPD, mild ([place]) 10/07/2013  . Depression   . Dysphagia   . GERD (gastroesophageal reflux disease)   .  H/O degenerative disc disease   . History of embolic stroke without residual deficits 01/05/2007  . Hypercholesterolemia 10/07/2013  . Hypertension   . Impingement syndrome, shoulder 10/07/2013  . Nocturnal muscle cramps   . Obstructive sleep apnea on CPAP 10/07/2013   Overview:  9 cm  . OSA on CPAP   . Osteoarthritis   . Primary osteoarthritis of right knee 08/23/2013  . Tear of medial cartilage or meniscus of knee, current 08/23/2013  . 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 TTP  (thrombotic thrombocytopenic purpura) ([place]) 10/07/2013   Overview:  S/P plasmapheresis 04/[facility] at [facility]  . Vitamin D deficiency     Surgical History: Past Surgical History:  Procedure Laterality Date  . ABDOMINAL HYSTERECTOMY    . ESOPHAGOGASTRODUODENOSCOPY (EGD) WITH PROPOFOL N/A 08/15/2015   Procedure: ESOPHAGOGASTRODUODENOSCOPY (EGD) WITH PROPOFOL;  Surgeon: Lollie Sails, MD;  Location: [facility] ENDOSCOPY;  Service: Endoscopy;  Laterality: N/A;  . REPLACEMENT TOTAL KNEE Right   . ROTATOR CUFF REPAIR Left   . ROTATOR CUFF REPAIR Right     Home Medications:    Medication List       Accurate as of 01/10/16  8:28 AM. Always use your most recent med list.          acetaminophen 650 MG CR tablet Commonly known as:  TYLENOL Take by mouth.   albuterol 108 (90 Base) MCG/ACT inhaler Commonly known as:  PROVENTIL HFA;VENTOLIN HFA Inhale 2 puffs into the lungs every 6 (six) hours as needed for wheezing or shortness of breath.   amLODipine 10 MG tablet Commonly known as:  NORVASC Take by mouth.   bisoprolol 5 MG tablet Commonly known as:  ZEBETA Take 5 mg by mouth daily.   CALTRATE 600+D PO Take by mouth.   celecoxib 200 MG capsule Commonly known as:  CELEBREX Take 200 mg by mouth 2 (two) times daily.   clindamycin 1 % external solution Commonly known as:  CLEOCIN T Apply topically.   clopidogrel 75 MG tablet Commonly known as:  PLAVIX Take 75 mg by mouth daily.   cyclobenzaprine 10 MG tablet Commonly known as:  FLEXERIL Take 10 mg by mouth 2 (two) times daily as needed for muscle spasms.   doxycycline 100 MG tablet Commonly known as:  VIBRA-TABS Take by mouth.   hydrochlorothiazide 25 MG tablet Commonly known as:  HYDRODIURIL Take 25 mg by mouth daily.   HYDROcodone-acetaminophen 5-325 MG tablet Commonly known as:  NORCO/VICODIN Take 1 tablet by mouth 2 (two) times daily as needed for moderate pain.   hydroxychloroquine 200 MG tablet Commonly known as:   PLAQUENIL Take 400 mg by mouth daily.   magnesium oxide 400 MG tablet Commonly known as:  MAG-OX Take by mouth.   pantoprazole 40 MG tablet Commonly known as:  PROTONIX Take 40 mg by mouth daily.   pravastatin 40 MG tablet Commonly known as:  PRAVACHOL Take 40 mg by mouth at bedtime.   ranitidine 150 MG tablet Commonly known as:  ZANTAC Take by mouth.   senna 8.6 MG tablet Commonly known as:  SENOKOT Take 2 tablets by mouth daily as needed for constipation.   spironolactone 25 MG tablet Commonly known as:  ALDACTONE Take 25 mg by mouth daily.   sucralfate 1 g tablet Commonly known as:  CARAFATE Take by mouth.   tiZANidine 4 MG capsule Commonly known as:  ZANAFLEX Take 4 mg by mouth every 8 (eight) hours as needed for muscle spasms.   vitamin B-12  500 MCG tablet Commonly known as:  CYANOCOBALAMIN Take 500 mcg by mouth daily.   Vitamin D (Ergocalciferol) 50000 units Caps capsule Commonly known as:  DRISDOL Take by mouth.   zolpidem 10 MG tablet Commonly known as:  AMBIEN Take 5 mg by mouth at bedtime as needed for sleep.       Allergies:  Allergies  Allergen Reactions  . Codeine     Family History: Family History  Problem Relation Age of Onset  . Kidney cancer Neg Hx   . Prostate cancer Neg Hx   . Bladder Cancer Neg Hx     Social History:  reports that she has been smoking.  She has never used smokeless tobacco. She reports that she does not drink alcohol or use drugs.  ROS:                                        Physical Exam: There were no vitals taken for this visit.  Constitutional: Well nourished. Alert and oriented, No acute distress. HEENT: [place] AT, moist mucus membranes. Trachea midline, no masses. Cardiovascular: No clubbing, cyanosis, or edema. Respiratory: Normal respiratory effort, no increased work of breathing. Skin: No rashes, bruises or suspicious lesions. Lymph: No cervical or inguinal  adenopathy. Neurologic: Grossly intact, no focal deficits, moving all 4 extremities. Psychiatric: Normal mood and affect.  Laboratory Data: Lab Results  Component Value Date   WBC 8.2 02/09/2014   HGB 9.6 (L) 02/24/2014   HCT 36.2 02/09/2014   MCV 90 02/09/2014   PLT 236 02/23/2014    Lab Results  Component Value Date   CREATININE 0.95 02/24/2014     PTNS treatment: The needle electrode was inserted into the lower, inner aspect of the patient's left leg. The surface electrode was placed on the inside arch of the foot on the treatment leg. The lead set was connected to the stimulator and the needle electrode clip was connected to the needle electrode. The stimulator that produces an adjustable electrical pulse that travels to the sacral nerve plexus via the tibial nerve was increased to 1 until  the patient received both a sensory response and toe flex.    1. Urinary frequency   Treatment Plan:    The needle electrode was removed without difficulty to the patient.  Patient tolerated the procedure for 30 minutes.  She will return next week for # 6 out of 12 of their weekly PTNS treatment's  2. Urge incontinence  - see above  No Follow-up on file.  These notes generated with voice recognition software. I apologize for typographical errors.  Zara Council, [facility] [address], [place], [place] [address] [phone]

## 2016-01-15 ENCOUNTER — Telehealth: Payer: Self-pay | Admitting: Urology

## 2016-01-15 NOTE — Telephone Encounter (Signed)
Patient called and cancelled all of her PTNS appts. She said she doesn't have a care and she doesn't want to reschd them at this time   [place]

## 2016-01-17 ENCOUNTER — Ambulatory Visit: Payer: Medicare Other | Admitting: Urology

## 2016-01-24 ENCOUNTER — Ambulatory Visit: Payer: Medicare Other | Admitting: Urology

## 2016-02-07 ENCOUNTER — Ambulatory Visit: Payer: Medicare Other | Admitting: Urology

## 2016-02-14 ENCOUNTER — Ambulatory Visit: Payer: Medicare Other | Admitting: Urology

## 2016-02-21 ENCOUNTER — Ambulatory Visit: Payer: Medicare Other | Admitting: Urology

## 2016-02-28 ENCOUNTER — Ambulatory Visit: Payer: Medicare Other | Admitting: Urology

## 2016-03-06 ENCOUNTER — Ambulatory Visit: Payer: Medicare Other | Admitting: Urology

## 2016-07-05 ENCOUNTER — Encounter
Admission: RE | Admit: 2016-07-05 | Discharge: 2016-07-05 | Disposition: A | Discharge status: Home / Self Care | Payer: Medicare Other | Source: Ambulatory Visit | Attending: Surgery | Admitting: Surgery

## 2016-07-05 DIAGNOSIS — Z01818 Encounter for other preprocedural examination: Secondary | ICD-10-CM | POA: Insufficient documentation

## 2016-07-05 DIAGNOSIS — K8010 Calculus of gallbladder with chronic cholecystitis without obstruction: Secondary | ICD-10-CM | POA: Insufficient documentation

## 2016-07-05 DIAGNOSIS — I10 Essential (primary) hypertension: Secondary | ICD-10-CM | POA: Insufficient documentation

## 2016-07-05 HISTORY — DX: Pain, unspecified: R52

## 2016-07-05 HISTORY — DX: Unspecified asthma, uncomplicated: J45.909

## 2016-07-05 HISTORY — DX: Fibromyalgia: M79.7

## 2016-07-05 HISTORY — DX: Cerebral infarction, unspecified: I63.9

## 2016-07-05 LAB — COMPREHENSIVE METABOLIC PANEL
ALT: 22 U/L (ref 14–54)
AST: 25 U/L (ref 15–41)
Albumin: 4.3 g/dL (ref 3.5–5.0)
Alkaline Phosphatase: 47 U/L (ref 38–126)
Anion gap: 10 (ref 5–15)
BUN: 25 mg/dL — ABNORMAL HIGH (ref 6–20)
CO2: 26 mmol/L (ref 22–32)
Calcium: 9.8 mg/dL (ref 8.9–10.3)
Chloride: 105 mmol/L (ref 101–111)
Creatinine, Ser: 1.49 mg/dL — ABNORMAL HIGH (ref 0.44–1.00)
GFR calc Af Amer: 43 mL/min — ABNORMAL LOW (ref >60)
GFR calc non Af Amer: 37 mL/min — ABNORMAL LOW (ref >60)
Glucose, Bld: 158 mg/dL — ABNORMAL HIGH (ref 65–99)
Potassium: 3.5 mmol/L (ref 3.5–5.1)
Sodium: 141 mmol/L (ref 135–145)
Total Bilirubin: 0.6 mg/dL (ref 0.3–1.2)
Total Protein: 7.9 g/dL (ref 6.5–8.1)

## 2016-07-05 LAB — CBC
HCT: 36 % (ref 35.0–47.0)
Hemoglobin: 12.1 g/dL (ref 12.0–16.0)
MCH: 29 pg (ref 26.0–34.0)
MCHC: 33.6 g/dL (ref 32.0–36.0)
MCV: 86.3 fL (ref 80.0–100.0)
Platelets: 238 10*3/uL (ref 150–440)
RBC: 4.17 MIL/uL (ref 3.80–5.20)
RDW: 13.6 % (ref 11.5–14.5)
WBC: 7.9 10*3/uL (ref 3.6–11.0)

## 2016-07-05 NOTE — Pre-Procedure Instructions (Signed)
Met B sent to Dr. Nicholes Stairs and Anesthesia for review.  Also informed Dr. Nicholes Stairs that H&P will be out of date, need new H&P.

## 2016-07-05 NOTE — Patient Instructions (Signed)
Your procedure is scheduled on: 07/12/16 Report to Same Day Surgery 2nd floor medical mall [facility] Entrance-take elevator on left to 2nd floor.  Check in with surgery information desk.) To find out your arrival time please call [phone] between 1PM - 3PM on 07/11/16  Remember: Instructions that are not followed completely may result in serious medical risk, up to and including death, or upon the discretion of your surgeon and anesthesiologist your surgery may need to be rescheduled.    _x___ 1. Do not eat food or drink liquids after midnight. No gum chewing or                              hard candies.     __x__ 2. No Alcohol for 24 hours before or after surgery.   __x__3. No Smoking for 24 prior to surgery.   ____  4. Bring all medications with you on the day of surgery if instructed.    __x__ 5. Notify your doctor if there is any change in your medical condition     (cold, fever, infections).     Do not wear jewelry, make-up, hairpins, clips or nail polish.  Do not wear lotions, powders, or perfumes. You may wear deodorant.  Do not shave 48 hours prior to surgery. Men may shave face and neck.  Do not bring valuables to the hospital.    [facility] is not responsible for any belongings or valuables.               Contacts, dentures or bridgework may not be worn into surgery.  Leave your suitcase in the car. After surgery it may be brought to your room.  For patients admitted to the hospital, discharge time is determined by your                       treatment team.   Patients discharged the day of surgery will not be allowed to drive home.  You will need someone to drive you home and stay with you the night of your procedure.    Please read over the following fact sheets that you were given:   [facility] Preparing for Surgery   _x___ Take anti-hypertensive (unless it includes a diuretic), cardiac, seizure, asthma,     anti-reflux and psychiatric medicines. These  include:  1. pantoprazole  2. Magnesium oxide  3. amlodipine  4. bisoprolol  5.  6.  ____Fleets enema or Magnesium Citrate as directed.   _x___ Use CHG Soap or sage wipes as directed on instruction sheet   _x___ Use inhalers on the day of surgery and bring to hospital day of surgery  ____ Stop Metformin and Janumet 2 days prior to surgery.    ____ Take 1/2 of usual insulin dose the night before surgery and none on the morning     surgery.   _x__ Follow recommendations from Cardiologist, Pulmonologist or PCP regarding          stopping Aspirin, Coumadin, Pllavix ,Eliquis, Effient, or Pradaxa, and Pletal.  STOP PLAVIX 5 DAYS BEFORE SURGERY X____Stop Anti-inflammatories such as Advil, Aleve, Ibuprofen, Motrin, Naproxen, Naprosyn, Goodies powders or aspirin products. OK to take Tylenol and                      STOPCelebrex. 2 DAYS BEFORE SURGERY   ___ Stop supplements until after surgery.  But may continue  Vitamin D, Vitamin B,       and multivitamin.   ____ Bring C-Pap to the hospital.

## 2016-07-11 MED ORDER — DEXTROSE 5 % IV SOLN
2.0000 g | Freq: Once | INTRAVENOUS | Status: AC
  Administered 2016-07-12: 2 g via INTRAVENOUS
  Filled 2016-07-11: qty 2

## 2016-07-12 ENCOUNTER — Encounter
Admission: RE | Disposition: A | Discharge status: Home / Self Care | Payer: Self-pay | Source: Ambulatory Visit | Attending: Surgery

## 2016-07-12 ENCOUNTER — Ambulatory Visit: Payer: Medicare Other | Admitting: Certified Registered"

## 2016-07-12 ENCOUNTER — Ambulatory Visit
Admission: RE | Admit: 2016-07-12 | Discharge: 2016-07-12 | Disposition: A | Discharge status: Home / Self Care | Payer: Medicare Other | Source: Ambulatory Visit | Attending: Surgery | Admitting: Surgery

## 2016-07-12 ENCOUNTER — Ambulatory Visit: Payer: Medicare Other

## 2016-07-12 ENCOUNTER — Encounter: Payer: Self-pay | Admitting: *Deleted

## 2016-07-12 DIAGNOSIS — F329 Major depressive disorder, single episode, unspecified: Secondary | ICD-10-CM | POA: Diagnosis not present

## 2016-07-12 DIAGNOSIS — Z419 Encounter for procedure for purposes other than remedying health state, unspecified: Secondary | ICD-10-CM

## 2016-07-12 DIAGNOSIS — K8010 Calculus of gallbladder with chronic cholecystitis without obstruction: Secondary | ICD-10-CM | POA: Diagnosis present

## 2016-07-12 DIAGNOSIS — F17200 Nicotine dependence, unspecified, uncomplicated: Secondary | ICD-10-CM | POA: Insufficient documentation

## 2016-07-12 DIAGNOSIS — Z79899 Other long term (current) drug therapy: Secondary | ICD-10-CM | POA: Insufficient documentation

## 2016-07-12 DIAGNOSIS — I129 Hypertensive chronic kidney disease with stage 1 through stage 4 chronic kidney disease, or unspecified chronic kidney disease: Secondary | ICD-10-CM | POA: Diagnosis not present

## 2016-07-12 DIAGNOSIS — N183 Chronic kidney disease, stage 3 (moderate): Secondary | ICD-10-CM | POA: Insufficient documentation

## 2016-07-12 DIAGNOSIS — E7800 Pure hypercholesterolemia, unspecified: Secondary | ICD-10-CM | POA: Diagnosis not present

## 2016-07-12 DIAGNOSIS — J449 Chronic obstructive pulmonary disease, unspecified: Secondary | ICD-10-CM | POA: Diagnosis not present

## 2016-07-12 DIAGNOSIS — G4733 Obstructive sleep apnea (adult) (pediatric): Secondary | ICD-10-CM | POA: Diagnosis not present

## 2016-07-12 DIAGNOSIS — E559 Vitamin D deficiency, unspecified: Secondary | ICD-10-CM | POA: Insufficient documentation

## 2016-07-12 DIAGNOSIS — Z8673 Personal history of transient ischemic attack (TIA), and cerebral infarction without residual deficits: Secondary | ICD-10-CM | POA: Insufficient documentation

## 2016-07-12 DIAGNOSIS — K219 Gastro-esophageal reflux disease without esophagitis: Secondary | ICD-10-CM | POA: Diagnosis not present

## 2016-07-12 DIAGNOSIS — M797 Fibromyalgia: Secondary | ICD-10-CM | POA: Insufficient documentation

## 2016-07-12 DIAGNOSIS — M329 Systemic lupus erythematosus, unspecified: Secondary | ICD-10-CM | POA: Insufficient documentation

## 2016-07-12 DIAGNOSIS — I2510 Atherosclerotic heart disease of native coronary artery without angina pectoris: Secondary | ICD-10-CM | POA: Insufficient documentation

## 2016-07-12 HISTORY — PX: CHOLECYSTECTOMY: SHX55

## 2016-07-12 SURGERY — LAPAROSCOPIC CHOLECYSTECTOMY WITH INTRAOPERATIVE CHOLANGIOGRAM
Anesthesia: General | Site: Abdomen | Wound class: Clean Contaminated

## 2016-07-12 MED ORDER — MEPERIDINE HCL 50 MG/ML IJ SOLN: 6.2500 mg | INTRAMUSCULAR | Status: DC | PRN

## 2016-07-12 MED ORDER — LIDOCAINE HCL (PF) 2 % IJ SOLN
INTRAMUSCULAR | Status: AC
  Filled 2016-07-12: qty 2

## 2016-07-12 MED ORDER — SUGAMMADEX SODIUM 200 MG/2ML IV SOLN
INTRAVENOUS | Status: DC | PRN
  Administered 2016-07-12: 160 mg via INTRAVENOUS

## 2016-07-12 MED ORDER — ROCURONIUM BROMIDE 100 MG/10ML IV SOLN
INTRAVENOUS | Status: DC | PRN
  Administered 2016-07-12 (×2): 5 mg via INTRAVENOUS
  Administered 2016-07-12: 40 mg via INTRAVENOUS

## 2016-07-12 MED ORDER — DEXAMETHASONE SODIUM PHOSPHATE 10 MG/ML IJ SOLN
INTRAMUSCULAR | Status: AC
  Filled 2016-07-12: qty 1

## 2016-07-12 MED ORDER — ROCURONIUM BROMIDE 50 MG/5ML IV SOLN
INTRAVENOUS | Status: AC
  Filled 2016-07-12: qty 1

## 2016-07-12 MED ORDER — OXYCODONE HCL 5 MG PO TABS
ORAL_TABLET | ORAL | Status: AC
  Filled 2016-07-12: qty 1

## 2016-07-12 MED ORDER — PROMETHAZINE HCL 25 MG/ML IJ SOLN: 6.2500 mg | INTRAMUSCULAR | Status: DC | PRN

## 2016-07-12 MED ORDER — OXYCODONE HCL 5 MG PO TABS: 5.0000 mg | ORAL_TABLET | Freq: Once | ORAL | Status: DC | PRN

## 2016-07-12 MED ORDER — OXYCODONE HCL 5 MG/5ML PO SOLN: 5.0000 mg | Freq: Once | ORAL | Status: DC | PRN

## 2016-07-12 MED ORDER — SUGAMMADEX SODIUM 200 MG/2ML IV SOLN
INTRAVENOUS | Status: AC
  Filled 2016-07-12: qty 2

## 2016-07-12 MED ORDER — FENTANYL CITRATE (PF) 100 MCG/2ML IJ SOLN
INTRAMUSCULAR | Status: AC
  Filled 2016-07-12: qty 2

## 2016-07-12 MED ORDER — MIDAZOLAM HCL 2 MG/2ML IJ SOLN
INTRAMUSCULAR | Status: DC | PRN
  Administered 2016-07-12: 2 mg via INTRAVENOUS

## 2016-07-12 MED ORDER — FENTANYL CITRATE (PF) 100 MCG/2ML IJ SOLN
INTRAMUSCULAR | Status: AC
  Administered 2016-07-12: 25 ug via INTRAVENOUS
  Filled 2016-07-12: qty 2

## 2016-07-12 MED ORDER — SODIUM CHLORIDE 0.9 % IV SOLN
INTRAVENOUS | Status: DC | PRN
  Administered 2016-07-12: 3 mL

## 2016-07-12 MED ORDER — HYDROCODONE-ACETAMINOPHEN 5-325 MG PO TABS: 1.0000 | ORAL_TABLET | ORAL | 0 refills | Status: DC | PRN

## 2016-07-12 MED ORDER — LACTATED RINGERS IV SOLN
INTRAVENOUS | Status: DC
  Administered 2016-07-12: 09:00:00 via INTRAVENOUS

## 2016-07-12 MED ORDER — HEPARIN SODIUM (PORCINE) 1000 UNIT/ML IJ SOLN
INTRAMUSCULAR | Status: DC | PRN
  Administered 2016-07-12: 100 mL via INTRAMUSCULAR

## 2016-07-12 MED ORDER — PROPOFOL 10 MG/ML IV BOLUS
INTRAVENOUS | Status: DC | PRN
Start: 2016-07-12 — End: 2016-07-12
  Administered 2016-07-12: 150 mg via INTRAVENOUS

## 2016-07-12 MED ORDER — LIDOCAINE HCL (CARDIAC) 20 MG/ML IV SOLN
INTRAVENOUS | Status: DC | PRN
  Administered 2016-07-12: 50 mg via INTRAVENOUS

## 2016-07-12 MED ORDER — ONDANSETRON HCL 4 MG/2ML IJ SOLN
INTRAMUSCULAR | Status: AC
  Filled 2016-07-12: qty 2

## 2016-07-12 MED ORDER — ONDANSETRON HCL 4 MG/2ML IJ SOLN
INTRAMUSCULAR | Status: DC | PRN
  Administered 2016-07-12: 4 mg via INTRAVENOUS

## 2016-07-12 MED ORDER — DEXAMETHASONE SODIUM PHOSPHATE 10 MG/ML IJ SOLN
INTRAMUSCULAR | Status: DC | PRN
  Administered 2016-07-12: 4 mg via INTRAVENOUS

## 2016-07-12 MED ORDER — IPRATROPIUM-ALBUTEROL 0.5-2.5 (3) MG/3ML IN SOLN
3.0000 mL | Freq: Once | RESPIRATORY_TRACT | Status: AC
  Administered 2016-07-12: 3 mL via RESPIRATORY_TRACT

## 2016-07-12 MED ORDER — MIDAZOLAM HCL 2 MG/2ML IJ SOLN
INTRAMUSCULAR | Status: AC
  Filled 2016-07-12: qty 2

## 2016-07-12 MED ORDER — HEPARIN SODIUM (PORCINE) 5000 UNIT/ML IJ SOLN
INTRAMUSCULAR | Status: AC
  Filled 2016-07-12: qty 1

## 2016-07-12 MED ORDER — PROPOFOL 10 MG/ML IV BOLUS
INTRAVENOUS | Status: AC
  Filled 2016-07-12: qty 20

## 2016-07-12 MED ORDER — FENTANYL CITRATE (PF) 100 MCG/2ML IJ SOLN
25.0000 ug | INTRAMUSCULAR | Status: AC | PRN
  Administered 2016-07-12 (×6): 25 ug via INTRAVENOUS

## 2016-07-12 MED ORDER — SODIUM CHLORIDE 0.9 % IJ SOLN
INTRAMUSCULAR | Status: AC
  Filled 2016-07-12: qty 10

## 2016-07-12 MED ORDER — EPHEDRINE SULFATE 50 MG/ML IJ SOLN
INTRAMUSCULAR | Status: AC
  Filled 2016-07-12: qty 1

## 2016-07-12 MED ORDER — EPHEDRINE SULFATE 50 MG/ML IJ SOLN
INTRAMUSCULAR | Status: DC | PRN
  Administered 2016-07-12: 10 mg via INTRAVENOUS

## 2016-07-12 MED ORDER — HYDROCODONE-ACETAMINOPHEN 5-325 MG PO TABS: 1.0000 | ORAL_TABLET | ORAL | Status: DC | PRN

## 2016-07-12 MED ORDER — SUCCINYLCHOLINE CHLORIDE 20 MG/ML IJ SOLN
INTRAMUSCULAR | Status: DC | PRN
  Administered 2016-07-12: 100 mg via INTRAVENOUS

## 2016-07-12 MED ORDER — FENTANYL CITRATE (PF) 100 MCG/2ML IJ SOLN
INTRAMUSCULAR | Status: DC | PRN
  Administered 2016-07-12: 50 ug via INTRAVENOUS
  Administered 2016-07-12: 100 ug via INTRAVENOUS

## 2016-07-12 SURGICAL SUPPLY — 37 items
APPLIER CLIP ROT 10 11.4 M/L (STAPLE) ×2
BENZOIN TINCTURE PRP APPL 2/3 (GAUZE/BANDAGES/DRESSINGS) ×2 IMPLANT
CANISTER SUCT 1200ML W/VALVE (MISCELLANEOUS) ×2 IMPLANT
CANNULA DILATOR 10 W/SLV (CANNULA) ×2 IMPLANT
CATH REDDICK CHOLANGI 4FR 50CM (CATHETERS) ×2 IMPLANT
CHLORAPREP W/TINT 26ML (MISCELLANEOUS) ×2 IMPLANT
CLIP APPLIE ROT 10 11.4 M/L (STAPLE) ×1 IMPLANT
DRAPE SHEET LG 3/4 BI-LAMINATE (DRAPES) ×2 IMPLANT
ELECT REM PT RETURN 9FT ADLT (ELECTROSURGICAL) ×2
ELECTRODE REM PT RTRN 9FT ADLT (ELECTROSURGICAL) ×1 IMPLANT
GAUZE SPONGE 4X4 12PLY STRL (GAUZE/BANDAGES/DRESSINGS) ×2 IMPLANT
GLOVE BIO SURGEON STRL SZ7.5 (GLOVE) ×2 IMPLANT
GLOVE BIOGEL M 6.5 STRL (GLOVE) ×4 IMPLANT
GOWN STRL REUS W/ TWL LRG LVL3 (GOWN DISPOSABLE) ×4 IMPLANT
GOWN STRL REUS W/TWL LRG LVL3 (GOWN DISPOSABLE) ×4
IRRIGATION STRYKERFLOW (MISCELLANEOUS) ×1 IMPLANT
IRRIGATOR STRYKERFLOW (MISCELLANEOUS) ×2
IV NS 1000ML (IV SOLUTION) ×1
IV NS 1000ML BAXH (IV SOLUTION) ×1 IMPLANT
KIT RM TURNOVER STRD PROC AR (KITS) ×2 IMPLANT
LABEL OR SOLS (LABEL) ×2 IMPLANT
NDL INSUFF ACCESS 14 VERSASTEP (NEEDLE) ×2 IMPLANT
NEEDLE FILTER BLUNT 18X 1/2SAF (NEEDLE) ×1
NEEDLE FILTER BLUNT 18X1 1/2 (NEEDLE) ×1 IMPLANT
NS IRRIG 500ML POUR BTL (IV SOLUTION) ×2 IMPLANT
PACK LAP CHOLECYSTECTOMY (MISCELLANEOUS) ×2 IMPLANT
SCISSORS METZENBAUM CVD 33 (INSTRUMENTS) ×2 IMPLANT
SEAL FOR SCOPE WARMER C3101 (MISCELLANEOUS) ×2 IMPLANT
SLEEVE ENDOPATH XCEL 5M (ENDOMECHANICALS) ×2 IMPLANT
STRIP CLOSURE SKIN 1/2X4 (GAUZE/BANDAGES/DRESSINGS) ×2 IMPLANT
SUT CHROMIC 5 0 RB 1 27 (SUTURE) ×4 IMPLANT
SUT VIC AB 0 CT2 27 (SUTURE) IMPLANT
SYR 3ML LL SCALE MARK (SYRINGE) ×2 IMPLANT
TROCAR XCEL NON-BLD 11X100MML (ENDOMECHANICALS) ×2 IMPLANT
TROCAR XCEL NON-BLD 5MMX100MML (ENDOMECHANICALS) ×2 IMPLANT
TUBING INSUFFLATOR HI FLOW (MISCELLANEOUS) ×2 IMPLANT
WATER STERILE IRR 1000ML POUR (IV SOLUTION) ×2 IMPLANT

## 2016-07-12 NOTE — H&P (Signed)
Bailey Dominguez is an 61 y.o. female.   Chief Complaint: Right upper abdominal pain HPI: She has a 10 year history of intermittent right upper quadrant abdominal pains. These pains radiating into the right subscapular area. She reports she had a recent pain which lasted 2 hours and was fairly severe. She had some associated nausea but no vomiting. Recent ultrasound demonstrated gallstones. She reports no history of jaundice or hepatitis  Past Medical History:  Diagnosis Date  . Allergic rhinitis   . ASCVD (arteriosclerotic cardiovascular disease) 04/14/2013   Overview:  A. CVA - 2008 B. Hyperlipidemia C. Cigarettes  . Asthma   . B12 deficiency   . Chickenpox   . Chronic kidney disease, stage III (moderate) 10/07/2013  . Connective tissue disease ([place])   . COPD (chronic obstructive pulmonary disease) ([place])   . COPD, mild ([place]) 10/07/2013  . Depression   . Dysphagia   . Fibromyalgia   . GERD (gastroesophageal reflux disease)   . H/O degenerative disc disease   . History of embolic stroke without residual deficits 01/05/2007  . Hypercholesterolemia 10/07/2013  . Hypertension   . Impingement syndrome, shoulder 10/07/2013   right  . Nocturnal muscle cramps   . Obstructive sleep apnea on CPAP 10/07/2013   Overview:  9 cm  . OSA on CPAP    no longer wears cpap  . Osteoarthritis    lupus  . Pain    intermittent cheat pain x 1 mo since grand daughter passes from brain tumor  . Primary osteoarthritis of right knee 08/23/2013  . Stroke [facility])    2008  no residual  . Tear of medial cartilage or meniscus of knee, current 08/23/2013   right  . 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 Vitamin D deficiency     Past Surgical History:  Procedure Laterality Date  . ABDOMINAL HYSTERECTOMY    . ESOPHAGOGASTRODUODENOSCOPY (EGD) WITH PROPOFOL N/A 08/15/2015   Procedure:  ESOPHAGOGASTRODUODENOSCOPY (EGD) WITH PROPOFOL;  Surgeon: Lollie Sails, MD;  Location: [facility] ENDOSCOPY;  Service: Endoscopy;  Laterality: N/A;  . JOINT REPLACEMENT    . REPLACEMENT TOTAL KNEE Right   . ROTATOR CUFF REPAIR Left   . ROTATOR CUFF REPAIR Right     Family History  Problem Relation Age of Onset  . Kidney cancer Neg Hx   . Prostate cancer Neg Hx   . Bladder Cancer Neg Hx    Social History:  reports that she has been smoking.  She has never used smokeless tobacco. She reports that she does not drink alcohol or use drugs.  Allergies:  Allergies  Allergen Reactions  . Codeine Itching    Medications Prior to Admission  Medication Sig Dispense Refill  . acetaminophen (TYLENOL) 650 MG CR tablet Take 1,300 mg by mouth every 8 (eight) hours as needed (for pain.).     Marland Kitchen albuterol (PROVENTIL HFA;VENTOLIN HFA) 108 (90 Base) MCG/ACT inhaler Inhale 2 puffs into the lungs every 6 (six) hours as needed for wheezing or shortness of breath.    Marland Kitchen amLODipine (NORVASC) 10 MG tablet Take 10 mg by mouth daily.     . bisoprolol (ZEBETA) 5 MG tablet Take 5 mg by mouth daily.    . Calcium Carbonate-Vit D-Min (GNP CALCIUM PLUS 600 +D PO) Take 1 tablet by mouth daily.    . celecoxib (CELEBREX) 200 MG capsule Take 200 mg by mouth 2 (  two) times daily as needed (for pain.).     Marland Kitchen clindamycin (CLEOCIN T) 1 % external solution Apply 1 application topically 2 (two) times daily as needed (for skin boils).     . clopidogrel (PLAVIX) 75 MG tablet Take 75 mg by mouth daily.    . cyclobenzaprine (FLEXERIL) 10 MG tablet Take 5 mg by mouth daily as needed for muscle spasms.     . hydrochlorothiazide (HYDRODIURIL) 25 MG tablet Take 25 mg by mouth daily.    . hydroxychloroquine (PLAQUENIL) 200 MG tablet Take 200 mg by mouth daily.     . magnesium oxide (MAG-OX) 400 MG tablet Take 400 mg by mouth daily.     . pantoprazole (PROTONIX) 40 MG tablet Take 40 mg by mouth daily.    . pravastatin (PRAVACHOL) 40 MG  tablet Take 40 mg by mouth at bedtime.    Marland Kitchen spironolactone (ALDACTONE) 25 MG tablet Take 25 mg by mouth daily.    . vitamin B-12 (CYANOCOBALAMIN) 500 MCG tablet Take 500 mcg by mouth daily.    . Vitamin D, Ergocalciferol, (DRISDOL) 50000 units CAPS capsule Take 50,000 Units by mouth every 14 (fourteen) days.     Marland Kitchen zolpidem (AMBIEN) 10 MG tablet Take 5 mg by mouth at bedtime as needed for sleep.    She last took her Plavix 5 days ago.   ROS she does report some fatigue also night sweats. She reports itchy eyes. She has some mild dysphagia which is chronic. She reports some shortness of breath with walking. She reports no chest pains. She has had some occasional constipation. She reports no urinary symptoms. She does have some arthritic pains.  CLINICAL DATA: Recent [place] is a metabolic panel was with a glucose of 158, BUN 25, creatinine 1.49, total bilirubin 0.6. CBC with a hemoglobin of 12.1 and platelet count 238,000  Blood pressure (!) 133/58, pulse (!) 51, temperature 97.6 F (36.4 C), temperature source Tympanic, resp. rate 18, height 5\' 1"  (1.549 m), weight 172 lb (78 kg), SpO2 99 %. Physical Exam  GENERAL:  Awake alert and oriented and in no acute distress.  HEENT:  Head is normocephalic.  Pupils are equal reactive to light.  Extraocular movements are intact. Sclera is clear.  Pharynx is clear.  NECK:  Supple with no palpable mass and no adenopathy.  LUNGS:  Clear without rales rhonchi or wheezes.  HEART:  Regular rhythm S1-S2, without murmur.  ABDOMEN: Soft and nontender with no palpable mass  NEUROLOGIC:  Awake alert and moving all extremities.  EXTREMITIES:  Well-developed well-nourished with no dependent edema.  Assessment/Plan Chronic cholecystitis cholelithiasis  I discussed laparoscopic cholecystectomy and postoperative care  Rochel Brome, MD 07/12/2016, 9:03 AM

## 2016-07-12 NOTE — Anesthesia Preprocedure Evaluation (Signed)
Anesthesia Evaluation  Patient identified by MRN, date of birth, ID band Patient awake    Reviewed: Allergy & Precautions, NPO status , Patient's Chart, lab work & pertinent test results  History of Anesthesia Complications Negative for: history of anesthetic complications  Airway Mallampati: II  TM Distance: >3 FB Neck ROM: Full    Dental  (+) Upper Dentures, Partial Lower   Pulmonary asthma , sleep apnea (does not wear CPAP because she says it makes her sleep walk) , COPD,  COPD inhaler, Current Smoker,    breath sounds clear to auscultation- rhonchi (-) wheezing      Cardiovascular Exercise Tolerance: Good hypertension, Pt. on medications (-) CAD, (-) Past MI and (-) Cardiac Stents  Rhythm:Regular Rate:Normal - Systolic murmurs and - Diastolic murmurs    Neuro/Psych PSYCHIATRIC DISORDERS Depression CVA, No Residual Symptoms    GI/Hepatic Neg liver ROS, GERD  ,  Endo/Other  negative endocrine ROSneg diabetes  Renal/GU Renal InsufficiencyRenal disease     Musculoskeletal  (+) Arthritis , Fibromyalgia -  Abdominal (+) + obese,   Peds  Hematology  (+) anemia ,   Anesthesia Other Findings Past Medical History: No date: Allergic rhinitis 04/14/2013: ASCVD (arteriosclerotic cardiovascular disease)     Comment: Overview:  A. CVA - 2008 B. Hyperlipidemia C.               Cigarettes No date: Asthma No date: B12 deficiency No date: Chickenpox 10/07/2013: Chronic kidney disease, stage III (moderate) No date: Connective tissue disease (H[facility]) No date: COPD (chronic obstructive pulmonary disease) (* 10/07/2013: COPD, mild (H[facility]) No date: Depression No date: Dysphagia No date: Fibromyalgia No date: GERD (gastroesophageal reflux disease) No date: H/O degenerative disc disease 01/05/2007: History of embolic stroke without residual def* 10/07/2013: Hypercholesterolemia No date: Hypertension 10/07/2013: Impingement syndrome,  shoulder     Comment: right No date: Nocturnal muscle cramps 10/07/2013: Obstructive sleep apnea on CPAP     Comment: Overview:  9 cm No date: OSA on CPAP     Comment: no longer wears cpap No date: Osteoarthritis     Comment: lupus No date: Pain     Comment: intermittent cheat pain x 1 mo since grand               daughter passes from brain tumor 08/23/2013: Primary osteoarthritis of right knee No date: Stroke [facility])     Comment: 2008  no residual 08/23/2013: Tear of medial cartilage or meniscus of knee, *     Comment: right No date: 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[place]) 10/07/2013: 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[facility])     Comment: Overview:  S/P plasmapheresis 04/2004 at [facility] No date: Vitamin D deficiency   Reproductive/Obstetrics                             Anesthesia Physical Anesthesia Plan  ASA: III  Anesthesia Plan: General   Post-op Pain Management:    Induction: Intravenous  PONV Risk Score and Plan:   Airway Management Planned: Oral ETT  Additional Equipment:   Intra-op Plan:   Post-operative Plan: Extubation in OR  Informed Consent: I have reviewed the patients History and Physical, chart, labs and discussed the procedure including the risks, benefits and alternatives for the proposed anesthesia with the patient or authorized representative who has indicated his/her understanding and acceptance.   Dental advisory given  Plan Discussed with: CRNA and Anesthesiologist  Anesthesia Plan Comments:  Anesthesia Quick Evaluation  

## 2016-07-12 NOTE — Anesthesia Postprocedure Evaluation (Signed)
Anesthesia Post Note  Patient: JOLEY UTECHT  Procedure(s) Performed: Procedure(s) (LRB): LAPAROSCOPIC CHOLECYSTECTOMY WITH INTRAOPERATIVE CHOLANGIOGRAM (N/A)  Patient location during evaluation: PACU Anesthesia Type: General Level of consciousness: awake and alert and oriented Pain management: pain level controlled Vital Signs Assessment: post-procedure vital signs reviewed and stable Respiratory status: spontaneous breathing, nonlabored ventilation and respiratory function stable Cardiovascular status: blood pressure returned to baseline and stable Postop Assessment: no signs of nausea or vomiting Anesthetic complications: no     Last Vitals:  Vitals:   07/12/16 1145 07/12/16 1150  BP:  (!) 150/53  Pulse: (!) 48 (!) 50  Resp: 12 13  Temp:      Last Pain:  Vitals:   07/12/16 1150  TempSrc:   PainSc: 1                  Archimedes Harold

## 2016-07-12 NOTE — Anesthesia Post-op Follow-up Note (Cosign Needed)
Anesthesia QCDR form completed.        

## 2016-07-12 NOTE — Op Note (Signed)
OPERATIVE REPORT  PREOPERATIVE DIAGNOSIS:  Chronic cholecystitis cholelithiasis  POSTOPERATIVE DIAGNOSIS: Chronic cholecystitis cholelithiasis  PROCEDURE: Laparoscopic cholecystectomy with cholangiogram  ANESTHESIA: General  SURGEON: Rochel Brome M.D.  INDICATIONS: She has a history of right upper quadrant pains and recent findings of gallstones. Surgery was recommended for definitive treatment.  With the patient on the operating table in the supine position under general endotracheal anesthesia the abdomen was prepared with ChloraPrep solution and draped in a sterile manner. A short incision was made in the inferior aspect of the umbilicus and carried down to the deep fascia which was grasped with a laryngeal hook. A Veress needle was inserted aspirated and irrigated with a saline solution. The peritoneal cavity was insufflated with carbon dioxide. The Veress needle was removed. The 10 mm cannula was inserted. The 10 mm 0 laparoscope was inserted to view the peritoneal cavity.  Another incision was made in the epigastrium slightly to the right of the midline to introduce an 11 mm cannula. 2 incisions were made in the lateral aspect of the right upper quadrant to introduce 2   5 mm cannulas. Initial inspection revealed the smooth surface of the liver  The gallbladder was retracted towards the right shoulder.  The gallbladder neck was retracted inferiorly and laterally.  The porta hepatis was identified. The gallbladder was mobilized with incision of the visceral peritoneum. The cystic duct was dissected free from surrounding structures. The cystic artery was dissected free from surrounding structures. A critical view of safety was demonstrated cystic artery was divided and controlled with Endo Clip.  An Endo Clip was placed across the cystic duct adjacent to the gallbladder neck. An incision was made in the cystic duct to introduce a Reddick catheter. The balloon would only stay and momentarily and 5  cc of half-strength Conray 60 dye was injected. The catheter was removed. A clip was placed across the cystic duct. The cholangiogram was done with a single film demonstrating dye in the distal common bile duct and dye in the duodenum. There was no apparent stone in the common bile duct.  The cystic duct ligated with a second Endo Clip and and divided.  The gallbladder was dissected free from the liver with use of hook and cautery and blunt dissection. One other small bleeding point was controlled with Endo Clip. Bleeding was minimal and hemostasis was subsequently intact. The gallbladder was delivered up through the infraumbilical incision opened and suctioned.  The gallbladder was removed and submitted with palpable small stone for routine pathology. The right upper quadrant was inspected and hemostasis was intact. The cannulas were removed allowing carbon dioxide to escape from the peritoneal cavity. The skin incisions were closed with interrupted 5-0 chromic subcutaneous suture benzoin and Steri-Strips. Gauze dressings were applied with paper tape.  The patient appeared to be in satisfactory condition and was prepared for transfer to the recovery room  [place].D.

## 2016-07-12 NOTE — Discharge Instructions (Addendum)
Take Tylenol or Norco if needed for pain.  Should not drive or do anything dangerous when taking Norco.  Remove dressings on Saturday. May shower Sunday.     AMBULATORY SURGERY  DISCHARGE INSTRUCTIONS   1) The drugs that you were given will stay in your system until tomorrow so for the next 24 hours you should not:  A) Drive an automobile B) Make any legal decisions C) Drink any alcoholic beverage   2) You may resume regular meals tomorrow.  Today it is better to start with liquids and gradually work up to solid foods.  You may eat anything you prefer, but it is better to start with liquids, then soup and crackers, and gradually work up to solid foods.   3) Please notify your doctor immediately if you have any unusual bleeding, trouble breathing, redness and pain at the surgery site, drainage, fever, or pain not relieved by medication.    4) Additional Instructions:        Please contact your physician with any problems or Same Day Surgery at [phone], Monday through Friday 6 am to 4 pm, or [place] at [facility] number at [phone].

## 2016-07-12 NOTE — Transfer of Care (Signed)
Immediate Anesthesia Transfer of Care Note  Patient: Bailey Dominguez  Procedure(s) Performed: Procedure(s): LAPAROSCOPIC CHOLECYSTECTOMY WITH INTRAOPERATIVE CHOLANGIOGRAM (N/A)  Patient Location: PACU  Anesthesia Type:General  Level of Consciousness: sedated and responds to stimulation  Airway & Oxygen Therapy: Patient Spontanous Breathing and Patient connected to face mask oxygen  Post-op Assessment: Report given to RN and Post -op Vital signs reviewed and stable  Post vital signs: Reviewed and stable  Last Vitals:  Vitals:   07/12/16 0812 07/12/16 1044  BP: (!) 133/58 (!) 159/69  Pulse: (!) 51 64  Resp: 18 16  Temp: 36.4 C     Last Pain:  Vitals:   07/12/16 0812  TempSrc: Tympanic  PainSc: 6          Complications: No apparent anesthesia complications

## 2016-07-12 NOTE — Anesthesia Procedure Notes (Signed)

## 2016-07-13 ENCOUNTER — Encounter: Payer: Self-pay | Admitting: Surgery

## 2016-07-15 LAB — SURGICAL PATHOLOGY

## 2017-03-17 ENCOUNTER — Ambulatory Visit
Admission: RE | Admit: 2017-03-17 | Discharge: 2017-03-17 | Disposition: A | Discharge status: Home / Self Care | Payer: Medicare Other | Source: Ambulatory Visit | Attending: Gastroenterology | Admitting: Gastroenterology

## 2017-03-17 ENCOUNTER — Encounter
Admission: RE | Disposition: A | Discharge status: Home / Self Care | Payer: Self-pay | Source: Ambulatory Visit | Attending: Gastroenterology

## 2017-03-17 ENCOUNTER — Ambulatory Visit: Payer: Medicare Other | Admitting: Certified Registered"

## 2017-03-17 ENCOUNTER — Encounter: Payer: Self-pay | Admitting: *Deleted

## 2017-03-17 DIAGNOSIS — N183 Chronic kidney disease, stage 3 (moderate): Secondary | ICD-10-CM | POA: Diagnosis not present

## 2017-03-17 DIAGNOSIS — K5730 Diverticulosis of large intestine without perforation or abscess without bleeding: Secondary | ICD-10-CM | POA: Insufficient documentation

## 2017-03-17 DIAGNOSIS — E538 Deficiency of other specified B group vitamins: Secondary | ICD-10-CM | POA: Diagnosis not present

## 2017-03-17 DIAGNOSIS — E785 Hyperlipidemia, unspecified: Secondary | ICD-10-CM | POA: Insufficient documentation

## 2017-03-17 DIAGNOSIS — Z791 Long term (current) use of non-steroidal anti-inflammatories (NSAID): Secondary | ICD-10-CM | POA: Insufficient documentation

## 2017-03-17 DIAGNOSIS — R1011 Right upper quadrant pain: Secondary | ICD-10-CM | POA: Insufficient documentation

## 2017-03-17 DIAGNOSIS — F17210 Nicotine dependence, cigarettes, uncomplicated: Secondary | ICD-10-CM | POA: Insufficient documentation

## 2017-03-17 DIAGNOSIS — M797 Fibromyalgia: Secondary | ICD-10-CM | POA: Diagnosis not present

## 2017-03-17 DIAGNOSIS — M6289 Other specified disorders of muscle: Secondary | ICD-10-CM | POA: Insufficient documentation

## 2017-03-17 DIAGNOSIS — I129 Hypertensive chronic kidney disease with stage 1 through stage 4 chronic kidney disease, or unspecified chronic kidney disease: Secondary | ICD-10-CM | POA: Diagnosis not present

## 2017-03-17 DIAGNOSIS — G4733 Obstructive sleep apnea (adult) (pediatric): Secondary | ICD-10-CM | POA: Insufficient documentation

## 2017-03-17 DIAGNOSIS — Z800 Family history of malignant neoplasm of digestive organs: Secondary | ICD-10-CM | POA: Diagnosis not present

## 2017-03-17 DIAGNOSIS — Z96651 Presence of right artificial knee joint: Secondary | ICD-10-CM | POA: Insufficient documentation

## 2017-03-17 DIAGNOSIS — J449 Chronic obstructive pulmonary disease, unspecified: Secondary | ICD-10-CM | POA: Insufficient documentation

## 2017-03-17 DIAGNOSIS — E559 Vitamin D deficiency, unspecified: Secondary | ICD-10-CM | POA: Diagnosis not present

## 2017-03-17 DIAGNOSIS — D124 Benign neoplasm of descending colon: Secondary | ICD-10-CM | POA: Insufficient documentation

## 2017-03-17 DIAGNOSIS — K621 Rectal polyp: Secondary | ICD-10-CM | POA: Insufficient documentation

## 2017-03-17 DIAGNOSIS — Q2739 Arteriovenous malformation, other site: Secondary | ICD-10-CM | POA: Diagnosis not present

## 2017-03-17 DIAGNOSIS — Z7902 Long term (current) use of antithrombotics/antiplatelets: Secondary | ICD-10-CM | POA: Insufficient documentation

## 2017-03-17 DIAGNOSIS — K3189 Other diseases of stomach and duodenum: Secondary | ICD-10-CM | POA: Diagnosis not present

## 2017-03-17 DIAGNOSIS — K2950 Unspecified chronic gastritis without bleeding: Secondary | ICD-10-CM | POA: Diagnosis not present

## 2017-03-17 DIAGNOSIS — K259 Gastric ulcer, unspecified as acute or chronic, without hemorrhage or perforation: Secondary | ICD-10-CM | POA: Diagnosis not present

## 2017-03-17 DIAGNOSIS — K219 Gastro-esophageal reflux disease without esophagitis: Secondary | ICD-10-CM | POA: Insufficient documentation

## 2017-03-17 DIAGNOSIS — K449 Diaphragmatic hernia without obstruction or gangrene: Secondary | ICD-10-CM | POA: Diagnosis not present

## 2017-03-17 DIAGNOSIS — E7800 Pure hypercholesterolemia, unspecified: Secondary | ICD-10-CM | POA: Insufficient documentation

## 2017-03-17 DIAGNOSIS — Z9049 Acquired absence of other specified parts of digestive tract: Secondary | ICD-10-CM | POA: Insufficient documentation

## 2017-03-17 DIAGNOSIS — Z8673 Personal history of transient ischemic attack (TIA), and cerebral infarction without residual deficits: Secondary | ICD-10-CM | POA: Insufficient documentation

## 2017-03-17 DIAGNOSIS — M329 Systemic lupus erythematosus, unspecified: Secondary | ICD-10-CM | POA: Insufficient documentation

## 2017-03-17 DIAGNOSIS — I739 Peripheral vascular disease, unspecified: Secondary | ICD-10-CM | POA: Insufficient documentation

## 2017-03-17 DIAGNOSIS — M311 Thrombotic microangiopathy: Secondary | ICD-10-CM | POA: Insufficient documentation

## 2017-03-17 DIAGNOSIS — Z79899 Other long term (current) drug therapy: Secondary | ICD-10-CM | POA: Insufficient documentation

## 2017-03-17 DIAGNOSIS — I2510 Atherosclerotic heart disease of native coronary artery without angina pectoris: Secondary | ICD-10-CM | POA: Insufficient documentation

## 2017-03-17 HISTORY — PX: ESOPHAGOGASTRODUODENOSCOPY (EGD) WITH PROPOFOL: SHX5813

## 2017-03-17 HISTORY — PX: COLONOSCOPY WITH PROPOFOL: SHX5780

## 2017-03-17 SURGERY — ESOPHAGOGASTRODUODENOSCOPY (EGD) WITH PROPOFOL
Anesthesia: General

## 2017-03-17 MED ORDER — LIDOCAINE HCL (PF) 1 % IJ SOLN
2.0000 mL | Freq: Once | INTRAMUSCULAR | Status: AC
  Administered 2017-03-17: 0.3 mL via INTRADERMAL

## 2017-03-17 MED ORDER — MIDAZOLAM HCL 2 MG/2ML IJ SOLN
INTRAMUSCULAR | Status: AC
  Filled 2017-03-17: qty 2

## 2017-03-17 MED ORDER — PROPOFOL 10 MG/ML IV BOLUS
INTRAVENOUS | Status: DC | PRN
  Administered 2017-03-17 (×2): 50 mg via INTRAVENOUS

## 2017-03-17 MED ORDER — PROPOFOL 500 MG/50ML IV EMUL
INTRAVENOUS | Status: DC | PRN
  Administered 2017-03-17: 120 ug/kg/min via INTRAVENOUS

## 2017-03-17 MED ORDER — LIDOCAINE HCL (PF) 1 % IJ SOLN
INTRAMUSCULAR | Status: AC
  Administered 2017-03-17: 0.3 mL via INTRADERMAL
  Filled 2017-03-17: qty 2

## 2017-03-17 MED ORDER — MIDAZOLAM HCL 5 MG/5ML IJ SOLN
INTRAMUSCULAR | Status: DC | PRN
  Administered 2017-03-17: 2 mg via INTRAVENOUS

## 2017-03-17 MED ORDER — LIDOCAINE 2% (20 MG/ML) 5 ML SYRINGE
INTRAMUSCULAR | Status: DC | PRN
  Administered 2017-03-17: 25 mg via INTRAVENOUS

## 2017-03-17 MED ORDER — SODIUM CHLORIDE 0.9 % IV SOLN
INTRAVENOUS | Status: DC
  Administered 2017-03-17: 1000 mL via INTRAVENOUS

## 2017-03-17 MED ORDER — SODIUM CHLORIDE 0.9 % IV SOLN: INTRAVENOUS | Status: DC

## 2017-03-17 MED ORDER — GLYCOPYRROLATE 0.2 MG/ML IJ SOLN
INTRAMUSCULAR | Status: DC | PRN
  Administered 2017-03-17: 0.2 mg via INTRAVENOUS

## 2017-03-17 NOTE — Op Note (Signed)
[facility] Gastroenterology Patient Name: Bailey Dominguez Procedure Date: 03/17/2017 11:04 AM MRN: [id number] Account #: [id number] Date of Birth: [birth year] Admit Type: Outpatient Age: 62 Room: [facility] ENDO ROOM 1 Gender: Female Note Status: Finalized Procedure:            Colonoscopy Indications:          Family history of colon cancer in a first-degree                        relative Providers:            Lollie Sails, MD Referring MD:         Christena Flake. Raechel Ache, MD (Referring MD) Medicines:            Monitored Anesthesia Care Complications:        No immediate complications. Procedure:            Pre-Anesthesia Assessment:                       - ASA Grade Assessment: III - A patient with severe                        systemic disease.                       After obtaining informed consent, the colonoscope was                        passed under direct vision. Throughout the procedure,                        the patient's blood pressure, pulse, and oxygen                        saturations were monitored continuously. The Olympus                        PCF-H180AL colonoscope ( S#: [id number] ) was introduced                        through the anus and advanced to the the cecum,                        identified by appendiceal orifice and ileocecal valve.                        The colonoscopy was performed with moderate difficulty                        due to poor bowel prep. Successful completion of the                        procedure was aided by lavage. Findings:      A 2 mm polyp was found in the descending colon. The polyp was sessile.       The polyp was removed with a cold biopsy forceps. Resection and       retrieval were complete.      A 3 mm polyp was found in the rectum. The polyp was sessile. The polyp       was removed with a cold  biopsy forceps. Resection and retrieval were       complete.      Multiple small-mouthed diverticula were found in the  sigmoid colon,       descending colon, transverse colon and ascending colon.      The exam was otherwise normal throughout the examined colon.      The digital rectal exam was normal, note prominant anal pillars. Impression:           - One 2 mm polyp in the descending colon, removed with                        a cold biopsy forceps. Resected and retrieved.                       - One 3 mm polyp in the rectum, removed with a cold                        biopsy forceps. Resected and retrieved.                       - Diverticulosis in the sigmoid colon, in the                        descending colon, in the transverse colon and in the                        ascending colon. Recommendation:       - Discharge patient to home.                       - Soft diet for 2 days, then advance as tolerated to                        advance diet as tolerated. Procedure Code(s):    --- Professional ---                       [phone], Colonoscopy, flexible; with biopsy, single or                        multiple Diagnosis Code(s):    --- Professional ---                       D12.4, Benign neoplasm of descending colon                       K62.1, Rectal polyp                       Z80.0, Family history of malignant neoplasm of                        digestive organs                       K57.30, Diverticulosis of large intestine without                        perforation or abscess without bleeding CPT copyright 2016 [employer]. All rights reserved. The codes documented in this report are preliminary and upon coder review may  be revised  to meet current compliance requirements. Lollie Sails, MD 03/17/2017 12:14:36 PM This report has been signed electronically. Number of Addenda: 0 Note Initiated On: 03/17/2017 11:04 AM Scope Withdrawal Time: 0 hours 15 minutes 59 seconds  Total Procedure Duration: 0 hours 22 minutes 45 seconds       [facility]

## 2017-03-17 NOTE — H&P (Signed)
Outpatient short stay form Pre-procedure 03/17/2017 11:11 AM Bailey Sails MD  Primary Physician: Dr. Genene Churn  Reason for visit: EGD and colonoscopy  History of present illness: Patient is a 62 year old female presenting today as above.  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  She also has had some right upper quadrant pain.  She had her gallbladder out about 8 months ago.  She has continued with right upper quadrant pain that radiates to the side.  She does take Protonix 40 mg twice a day.  She also takes Celebrex as well as Plavix.  She last took her Plavix 6 days ago.  She denies use of any aspirin or other aspirin products.  The episodes of right upper quadrant pain may occur 2 or 3 times a month.    Current Facility-Administered Medications:  .  0.9 %  sodium chloride infusion, , Intravenous, Continuous, Bailey Sails, MD, Last Rate: 20 mL/hr at 03/17/17 1021, 1,000 mL at 03/17/17 1021 .  0.9 %  sodium chloride infusion, , Intravenous, Continuous, Bailey Sails, MD  Medications Prior to Admission  Medication Sig Dispense Refill Last Dose  . tiZANidine (ZANAFLEX) 4 MG capsule Take 4 mg by mouth 3 (three) times daily as needed for muscle spasms.     Marland Kitchen acetaminophen (TYLENOL) 650 MG CR tablet Take 1,300 mg by mouth every 8 (eight) hours as needed (for pain.).    06/28/2016  . albuterol (PROVENTIL HFA;VENTOLIN HFA) 108 (90 Base) MCG/ACT inhaler Inhale 2 puffs into the lungs every 6 (six) hours as needed for wheezing or shortness of breath.   12/13/2015  . amLODipine (NORVASC) 10 MG tablet Take 10 mg by mouth daily.    07/12/2016 at Unknown time  . bisoprolol (ZEBETA) 5 MG tablet Take 5 mg by mouth daily.   03/17/2017 at 0530  . Calcium Carbonate-Vit D-Min (GNP CALCIUM PLUS 600 +D PO) Take 1 tablet by mouth daily.   07/11/2016 at Unknown time  . celecoxib (CELEBREX) 200 MG capsule Take 200 mg by mouth 2 (two) times daily as needed (for  pain.).    07/09/2016  . clindamycin (CLEOCIN T) 1 % external solution Apply 1 application topically 2 (two) times daily as needed (for skin boils).    07/13/2015  . clopidogrel (PLAVIX) 75 MG tablet Take 75 mg by mouth daily.   03/11/2017  . cyclobenzaprine (FLEXERIL) 10 MG tablet Take 5 mg by mouth daily as needed for muscle spasms.    02/12/2016  . hydrochlorothiazide (HYDRODIURIL) 25 MG tablet Take 25 mg by mouth daily.   03/17/2017 at 0530  . HYDROcodone-acetaminophen (NORCO) 5-325 MG tablet Take 1-2 tablets by mouth every 4 (four) hours as needed for moderate pain. 12 tablet 0   . hydroxychloroquine (PLAQUENIL) 200 MG tablet Take 200 mg by mouth daily.    06/28/2016  . magnesium oxide (MAG-OX) 400 MG tablet Take 400 mg by mouth daily.    07/11/2016 at Unknown time  . pantoprazole (PROTONIX) 40 MG tablet Take 40 mg by mouth daily.   07/12/2016 at Unknown time  . pravastatin (PRAVACHOL) 40 MG tablet Take 40 mg by mouth at bedtime.   07/11/2016 at Unknown time  . spironolactone (ALDACTONE) 25 MG tablet Take 25 mg by mouth daily.   03/17/2017 at 0530  . vitamin B-12 (CYANOCOBALAMIN) 500 MCG tablet Take 500 mcg by mouth daily.   Past Week at Unknown time  . Vitamin D, Ergocalciferol, (DRISDOL) 50000 units  CAPS capsule Take 50,000 Units by mouth every 14 (fourteen) days.    07/11/2016 at Unknown time  . zolpidem (AMBIEN) 10 MG tablet Take 5 mg by mouth at bedtime as needed for sleep.   06/28/2016     Allergies  Allergen Reactions  . Codeine Itching     Past Medical History:  Diagnosis Date  . Allergic rhinitis   . ASCVD (arteriosclerotic cardiovascular disease) 04/14/2013   Overview:  A. CVA - 2008 B. Hyperlipidemia C. Cigarettes  . Asthma   . B12 deficiency   . Chickenpox   . Chronic kidney disease, stage III (moderate) ([place]) 10/07/2013  . Connective tissue disease ([place])   . COPD (chronic obstructive pulmonary disease) ([place])   . COPD, mild ([place]) 10/07/2013  . Depression   . Dysphagia   . Fibromyalgia    . GERD (gastroesophageal reflux disease)   . H/O degenerative disc disease   . History of embolic stroke without residual deficits 01/05/2007  . Hypercholesterolemia 10/07/2013  . Hypertension   . Impingement syndrome, shoulder 10/07/2013   right  . Nocturnal muscle cramps   . Obstructive sleep apnea on CPAP 10/07/2013   Overview:  9 cm  . OSA on CPAP    no longer wears cpap  . Osteoarthritis    lupus  . Pain    intermittent cheat pain x 1 mo since grand daughter passes from brain tumor  . Primary osteoarthritis of right knee 08/23/2013  . Stroke [facility])    2008  no residual  . Tear of medial cartilage or meniscus of knee, current 08/23/2013   right  . 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 Vitamin D deficiency     Review of systems:      Physical Exam    Heart and lungs: Rhythm without rub or gallop, lungs are bilaterally clear.    HEENT: Normocephalic atraumatic eyes are anicteric    Other:    Pertinant exam for procedure: Soft nontender nondistended bowel sounds positive normoactive    Planned proceedures: EGD, colonoscopy and indicated procedures. I have discussed the risks benefits and complications of procedures to include not limited to bleeding, infection, perforation and the risk of sedation and the patient wishes to proceed.    Bailey Sails, MD Gastroenterology 03/17/2017  11:11 AM

## 2017-03-17 NOTE — Transfer of Care (Signed)
Immediate Anesthesia Transfer of Care Note  Patient: Bailey Dominguez  Procedure(s) Performed: ESOPHAGOGASTRODUODENOSCOPY (EGD) WITH PROPOFOL (N/A ) COLONOSCOPY WITH PROPOFOL (N/A )  Patient Location: Endoscopy Unit  Anesthesia Type:General  Level of Consciousness: awake and alert   Airway & Oxygen Therapy: Patient Spontanous Breathing and Patient connected to nasal cannula oxygen  Post-op Assessment: Report given to RN and Post -op Vital signs reviewed and stable  Post vital signs: Reviewed  Last Vitals:  Vitals:   03/17/17 1003 03/17/17 1214  BP: (!) 145/52 123/66  Pulse: 72 74  Resp: 17 13  Temp: (!) 36.2 C (!) 36 C  SpO2: 98% 98%    Last Pain: There were no vitals filed for this visit.       Complications: No apparent anesthesia complications

## 2017-03-17 NOTE — Op Note (Signed)
[facility] Gastroenterology Patient Name: Bailey Dominguez Procedure Date: 03/17/2017 11:11 AM MRN: [id number] Account #: [id number] Date of Birth: 03/08/1955 Admit Type: Outpatient Age: 62 Room: [facility] ENDO ROOM 1 Gender: Female Note Status: Finalized Procedure:            Upper GI endoscopy Indications:          Abdominal pain in the right upper quadrant Providers:            Lollie Sails, MD Referring MD:         Christena Flake. Raechel Ache, MD (Referring MD) Medicines:            Monitored Anesthesia Care Complications:        No immediate complications. Procedure:            Pre-Anesthesia Assessment:                       - ASA Grade Assessment: III - A patient with severe                        systemic disease.                       After obtaining informed consent, the endoscope was                        passed under direct vision. Throughout the procedure,                        the patient's blood pressure, pulse, and oxygen                        saturations were monitored continuously. The Endoscope                        was introduced through the mouth, and advanced to the                        third part of duodenum. The upper GI endoscopy was                        accomplished without difficulty. The patient tolerated                        the procedure well. Findings:      The Z-line was variable. Biopsies were taken with a cold forceps for       histology.      The exam of the esophagus was otherwise normal.      Diffuse minimal inflammation characterized by congestion (edema) and       erythema was found in the gastric body. Biopsies were taken with a cold       forceps for histology.      A single 10 mm mucosal/submucosal papule (nodule) no bleeding and no       stigmata of bleeding was found on the posterior wall of the gastric       antrum. Biopsies were taken with a cold forceps for histology.      A single localized, 4 mm no bleeding but  atypical in appearance erosion       was found on the anterior wall of the gastric antrum. There  were no       stigmata of recent bleeding. Biopsies were taken with a cold forceps for       histology.      The cardia and gastric fundus were normal on retroflexion.      note small hiatal hernia      The examined duodenum was normal.      Note small to medium sized AVM on the tip of the tongue. Impression:           - Z-line variable. Biopsied.                       - Gastritis. Biopsied.                       - A single mucosal papule (nodule) found in the                        stomach. Biopsied.                       - No bleeding but atypical in appearance erosive                        gastropathy. Biopsied.                       - Normal examined duodenum. Recommendation:       - Continue present medications.                       - Await pathology results.                       - Return to GI clinic in 4 weeks. Procedure Code(s):    --- Professional ---                       [phone], Esophagogastroduodenoscopy, flexible, transoral;                        with biopsy, single or multiple Diagnosis Code(s):    --- Professional ---                       K22.8, Other specified diseases of esophagus                       K29.70, Gastritis, unspecified, without bleeding                       K31.89, Other diseases of stomach and duodenum                       R10.11, Right upper quadrant pain CPT copyright 2016 American Medical Association. All rights reserved. The codes documented in this report are preliminary and upon coder review may  be revised to meet current compliance requirements. Lollie Sails, MD 03/17/2017 11:44:29 AM This report has been signed electronically. Number of Addenda: 0 Note Initiated On: 03/17/2017 11:11 AM      [facility]

## 2017-03-17 NOTE — Anesthesia Post-op Follow-up Note (Signed)
Anesthesia QCDR form completed.        

## 2017-03-17 NOTE — Anesthesia Preprocedure Evaluation (Signed)
Anesthesia Evaluation  Patient identified by MRN, date of birth, ID band Patient awake    Reviewed: Allergy & Precautions, H&P , NPO status , Patient's Chart, lab work & pertinent test results, reviewed documented beta blocker date and time   Airway Mallampati: II   Neck ROM: full    Dental  (+) Poor Dentition   Pulmonary neg pulmonary ROS, asthma , sleep apnea and Continuous Positive Airway Pressure Ventilation , COPD,  COPD inhaler, Current Smoker,    Pulmonary exam normal        Cardiovascular Exercise Tolerance: Poor hypertension, On Medications + angina with exertion + Peripheral Vascular Disease  negative cardio ROS Normal cardiovascular exam Rhythm:regular Rate:Normal  Hx of occasional chest pain with exertion.  Stable pattern.  On meds and compliant. JA   Neuro/Psych PSYCHIATRIC DISORDERS  Neuromuscular disease CVA, No Residual Symptoms negative neurological ROS  negative psych ROS   GI/Hepatic negative GI ROS, Neg liver ROS, GERD  Medicated,  Endo/Other  negative endocrine ROS  Renal/GU Renal diseasenegative Renal ROS  negative genitourinary   Musculoskeletal   Abdominal   Peds  Hematology negative hematology ROS (+) anemia ,   Anesthesia Other Findings Past Medical History: No date: Allergic rhinitis 04/14/2013: ASCVD (arteriosclerotic cardiovascular disease)     Comment:  Overview:  A. CVA - 2008 B. Hyperlipidemia C. Cigarettes No date: Asthma No date: B12 deficiency No date: Chickenpox 10/07/2013: Chronic kidney disease, stage III (moderate) (HCC) No date: Connective tissue disease ([place]) No date: COPD (chronic obstructive pulmonary disease) ([place]) 10/07/2013: COPD, mild (HCC) No date: Depression No date: Dysphagia No date: Fibromyalgia No date: GERD (gastroesophageal reflux disease) No date: H/O degenerative disc disease 01/05/2007: History of embolic stroke without residual deficits 10/07/2013:  Hypercholesterolemia No date: Hypertension 10/07/2013: Impingement syndrome, shoulder     Comment:  right No date: Nocturnal muscle cramps 10/07/2013: Obstructive sleep apnea on CPAP     Comment:  Overview:  9 cm No date: OSA on CPAP     Comment:  no longer wears cpap No date: Osteoarthritis     Comment:  lupus No date: Pain     Comment:  intermittent cheat pain x 1 mo since grand daughter               passes from brain tumor 08/23/2013: Primary osteoarthritis of right knee No date: Stroke [facility])     Comment:  2008  no residual 08/23/2013: Tear of medial cartilage or meniscus of knee, current     Comment:  right No date: 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[place]) 10/07/2013: 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HCC)     Comment:  Overview:  S/P plasmapheresis 04/2004 at [facility] No date: Vitamin D deficiency Past Surgical History: No date: ABDOMINAL HYSTERECTOMY 07/12/2016: CHOLECYSTECTOMY; N/A     Comment:  Procedure: LAPAROSCOPIC CHOLECYSTECTOMY WITH               INTRAOPERATIVE CHOLANGIOGRAM;  Surgeon: Leonie Green, MD;  Location: [facility] ORS;  Service: General;                Laterality: N/A; 08/15/2015: ESOPHAGOGASTRODUODENOSCOPY (EGD) WITH PROPOFOL; N/A     Comment:  Procedure: ESOPHAGOGASTRODUODENOSCOPY (EGD) WITH               PROPOFOL;  Surgeon: Lollie Sails, MD;  Location:               [facility] ENDOSCOPY;  Service:  Endoscopy;  Laterality: N/A; 2016: JOINT REPLACEMENT; Right No date: REPLACEMENT TOTAL KNEE; Right No date: ROTATOR CUFF REPAIR; Left No date: ROTATOR CUFF REPAIR; Right BMI    Body Mass Index:  35.14 kg/m     Reproductive/Obstetrics negative OB ROS                             Anesthesia Physical Anesthesia Plan  ASA: IV  Anesthesia Plan: General   Post-op Pain Management:    Induction:   PONV Risk Score and Plan:   Airway Management Planned:   Additional Equipment:   Intra-op Plan:    Post-operative Plan:   Informed Consent: I have reviewed the patients History and Physical, chart, labs and discussed the procedure including the risks, benefits and alternatives for the proposed anesthesia with the patient or authorized representative who has indicated his/her understanding and acceptance.   Dental Advisory Given  Plan Discussed with: CRNA  Anesthesia Plan Comments:         Anesthesia Quick Evaluation

## 2017-03-18 ENCOUNTER — Encounter: Payer: Self-pay | Admitting: Gastroenterology

## 2017-03-18 LAB — SURGICAL PATHOLOGY

## 2017-03-18 NOTE — Anesthesia Postprocedure Evaluation (Signed)
Anesthesia Post Note  Patient: Bailey Dominguez  Procedure(s) Performed: ESOPHAGOGASTRODUODENOSCOPY (EGD) WITH PROPOFOL (N/A ) COLONOSCOPY WITH PROPOFOL (N/A )  Patient location during evaluation: PACU Anesthesia Type: General Level of consciousness: awake and alert Pain management: pain level controlled Vital Signs Assessment: post-procedure vital signs reviewed and stable Respiratory status: spontaneous breathing, nonlabored ventilation, respiratory function stable and patient connected to nasal cannula oxygen Cardiovascular status: blood pressure returned to baseline and stable Postop Assessment: no apparent nausea or vomiting Anesthetic complications: no     Last Vitals:  Vitals:   03/17/17 1230 03/17/17 1240  BP: 117/62 126/61  Pulse:  69  Resp:  19  Temp:    SpO2:  99%    Last Pain:  Vitals:   03/18/17 0815  PainSc: 0-No pain                 Molli Barrows

## 2017-06-19 ENCOUNTER — Ambulatory Visit: Payer: Medicare Other | Attending: Neurology

## 2017-06-19 DIAGNOSIS — G4733 Obstructive sleep apnea (adult) (pediatric): Secondary | ICD-10-CM | POA: Insufficient documentation

## 2017-06-19 DIAGNOSIS — F5101 Primary insomnia: Secondary | ICD-10-CM | POA: Diagnosis not present

## 2017-11-12 ENCOUNTER — Other Ambulatory Visit: Payer: Self-pay | Admitting: Internal Medicine

## 2017-11-12 DIAGNOSIS — Z1231 Encounter for screening mammogram for malignant neoplasm of breast: Secondary | ICD-10-CM

## 2018-01-15 ENCOUNTER — Ambulatory Visit
Admission: RE | Admit: 2018-01-15 | Discharge: 2018-01-15 | Disposition: A | Discharge status: Home / Self Care | Payer: Medicare Other | Source: Ambulatory Visit | Attending: Internal Medicine | Admitting: Internal Medicine

## 2018-01-15 DIAGNOSIS — Z1231 Encounter for screening mammogram for malignant neoplasm of breast: Secondary | ICD-10-CM | POA: Insufficient documentation

## 2018-06-02 ENCOUNTER — Other Ambulatory Visit: Payer: Self-pay | Admitting: Gastroenterology

## 2018-06-02 ENCOUNTER — Other Ambulatory Visit: Payer: Self-pay | Admitting: Acute Care

## 2018-06-02 DIAGNOSIS — I639 Cerebral infarction, unspecified: Secondary | ICD-10-CM

## 2018-06-02 DIAGNOSIS — R1314 Dysphagia, pharyngoesophageal phase: Secondary | ICD-10-CM

## 2018-07-10 ENCOUNTER — Ambulatory Visit: Payer: Medicare Other

## 2018-07-13 ENCOUNTER — Other Ambulatory Visit: Payer: Self-pay | Admitting: Acute Care

## 2018-07-13 ENCOUNTER — Ambulatory Visit
Admission: RE | Admit: 2018-07-13 | Discharge: 2018-07-13 | Disposition: A | Discharge status: Home / Self Care | Payer: Medicare Other | Source: Ambulatory Visit | Attending: Acute Care | Admitting: Acute Care

## 2018-07-13 ENCOUNTER — Other Ambulatory Visit: Payer: Self-pay

## 2018-07-13 DIAGNOSIS — I639 Cerebral infarction, unspecified: Secondary | ICD-10-CM | POA: Insufficient documentation

## 2018-07-13 LAB — POCT I-STAT CREATININE: Creatinine, Ser: 1.1 mg/dL — ABNORMAL HIGH (ref 0.44–1.00)

## 2018-07-13 MED ORDER — GADOBUTROL 1 MMOL/ML IV SOLN: 8.0000 mL | Freq: Once | INTRAVENOUS | Status: DC | PRN

## 2018-07-15 ENCOUNTER — Other Ambulatory Visit: Payer: Self-pay

## 2018-07-15 ENCOUNTER — Other Ambulatory Visit: Payer: Self-pay | Admitting: Gastroenterology

## 2018-07-15 ENCOUNTER — Ambulatory Visit
Admission: RE | Admit: 2018-07-15 | Discharge: 2018-07-15 | Disposition: A | Discharge status: Home / Self Care | Payer: Medicare Other | Source: Ambulatory Visit | Attending: Gastroenterology | Admitting: Gastroenterology

## 2018-07-15 DIAGNOSIS — R1314 Dysphagia, pharyngoesophageal phase: Secondary | ICD-10-CM

## 2018-08-17 ENCOUNTER — Other Ambulatory Visit
Admission: RE | Admit: 2018-08-17 | Discharge: 2018-08-17 | Disposition: A | Discharge status: Home / Self Care | Payer: Medicare Other | Source: Ambulatory Visit | Attending: Gastroenterology | Admitting: Gastroenterology

## 2018-08-17 ENCOUNTER — Other Ambulatory Visit: Payer: Self-pay

## 2018-08-17 DIAGNOSIS — Z1159 Encounter for screening for other viral diseases: Secondary | ICD-10-CM | POA: Insufficient documentation

## 2018-08-18 LAB — SARS CORONAVIRUS 2 (TAT 6-24 HRS): SARS Coronavirus 2: NEGATIVE

## 2018-08-20 ENCOUNTER — Ambulatory Visit: Admission: RE | Admit: 2018-08-20 | Payer: Medicare Other | Source: Home / Self Care | Admitting: Gastroenterology

## 2018-08-20 ENCOUNTER — Encounter: Admission: RE | Payer: Self-pay | Source: Home / Self Care

## 2018-08-20 SURGERY — ESOPHAGOGASTRODUODENOSCOPY (EGD) WITH PROPOFOL
Anesthesia: General

## 2018-08-21 ENCOUNTER — Other Ambulatory Visit
Admission: RE | Admit: 2018-08-21 | Discharge: 2018-08-21 | Disposition: A | Discharge status: Home / Self Care | Payer: Medicare Other | Source: Ambulatory Visit | Attending: Gastroenterology | Admitting: Gastroenterology

## 2018-09-17 ENCOUNTER — Other Ambulatory Visit: Payer: Self-pay

## 2018-09-17 ENCOUNTER — Other Ambulatory Visit
Admission: RE | Admit: 2018-09-17 | Discharge: 2018-09-17 | Disposition: A | Discharge status: Home / Self Care | Payer: Medicare Other | Source: Ambulatory Visit | Attending: Gastroenterology | Admitting: Gastroenterology

## 2018-09-17 DIAGNOSIS — Z01812 Encounter for preprocedural laboratory examination: Secondary | ICD-10-CM | POA: Diagnosis present

## 2018-09-17 DIAGNOSIS — R1310 Dysphagia, unspecified: Secondary | ICD-10-CM | POA: Diagnosis not present

## 2018-09-17 DIAGNOSIS — R1013 Epigastric pain: Secondary | ICD-10-CM | POA: Diagnosis not present

## 2018-09-17 DIAGNOSIS — K3189 Other diseases of stomach and duodenum: Secondary | ICD-10-CM | POA: Insufficient documentation

## 2018-09-17 DIAGNOSIS — Z20828 Contact with and (suspected) exposure to other viral communicable diseases: Secondary | ICD-10-CM | POA: Diagnosis not present

## 2018-09-17 LAB — SARS CORONAVIRUS 2 (TAT 6-24 HRS): SARS Coronavirus 2: NEGATIVE

## 2018-09-18 ENCOUNTER — Encounter: Payer: Self-pay | Admitting: Anesthesiology

## 2018-09-21 ENCOUNTER — Ambulatory Visit: Payer: Medicare Other | Admitting: Anesthesiology

## 2018-09-21 ENCOUNTER — Encounter: Payer: Self-pay | Admitting: *Deleted

## 2018-09-21 ENCOUNTER — Other Ambulatory Visit: Payer: Self-pay

## 2018-09-21 ENCOUNTER — Encounter
Admission: RE | Disposition: A | Discharge status: Home / Self Care | Payer: Self-pay | Source: Home / Self Care | Attending: Gastroenterology

## 2018-09-21 ENCOUNTER — Ambulatory Visit
Admission: RE | Admit: 2018-09-21 | Discharge: 2018-09-21 | Disposition: A | Discharge status: Home / Self Care | Payer: Medicare Other | Attending: Gastroenterology | Admitting: Gastroenterology

## 2018-09-21 DIAGNOSIS — D649 Anemia, unspecified: Secondary | ICD-10-CM | POA: Insufficient documentation

## 2018-09-21 DIAGNOSIS — M1990 Unspecified osteoarthritis, unspecified site: Secondary | ICD-10-CM | POA: Diagnosis not present

## 2018-09-21 DIAGNOSIS — J449 Chronic obstructive pulmonary disease, unspecified: Secondary | ICD-10-CM | POA: Diagnosis not present

## 2018-09-21 DIAGNOSIS — Z8673 Personal history of transient ischemic attack (TIA), and cerebral infarction without residual deficits: Secondary | ICD-10-CM | POA: Diagnosis not present

## 2018-09-21 DIAGNOSIS — R1013 Epigastric pain: Secondary | ICD-10-CM | POA: Diagnosis present

## 2018-09-21 DIAGNOSIS — M3214 Glomerular disease in systemic lupus erythematosus: Secondary | ICD-10-CM | POA: Diagnosis not present

## 2018-09-21 DIAGNOSIS — G709 Myoneural disorder, unspecified: Secondary | ICD-10-CM | POA: Diagnosis not present

## 2018-09-21 DIAGNOSIS — E559 Vitamin D deficiency, unspecified: Secondary | ICD-10-CM | POA: Insufficient documentation

## 2018-09-21 DIAGNOSIS — Z7902 Long term (current) use of antithrombotics/antiplatelets: Secondary | ICD-10-CM | POA: Diagnosis not present

## 2018-09-21 DIAGNOSIS — M797 Fibromyalgia: Secondary | ICD-10-CM | POA: Insufficient documentation

## 2018-09-21 DIAGNOSIS — K219 Gastro-esophageal reflux disease without esophagitis: Secondary | ICD-10-CM | POA: Diagnosis not present

## 2018-09-21 DIAGNOSIS — I2510 Atherosclerotic heart disease of native coronary artery without angina pectoris: Secondary | ICD-10-CM | POA: Insufficient documentation

## 2018-09-21 DIAGNOSIS — G4733 Obstructive sleep apnea (adult) (pediatric): Secondary | ICD-10-CM | POA: Insufficient documentation

## 2018-09-21 DIAGNOSIS — K2950 Unspecified chronic gastritis without bleeding: Secondary | ICD-10-CM | POA: Diagnosis not present

## 2018-09-21 DIAGNOSIS — M311 Thrombotic microangiopathy: Secondary | ICD-10-CM | POA: Diagnosis not present

## 2018-09-21 DIAGNOSIS — N183 Chronic kidney disease, stage 3 (moderate): Secondary | ICD-10-CM | POA: Diagnosis not present

## 2018-09-21 DIAGNOSIS — E785 Hyperlipidemia, unspecified: Secondary | ICD-10-CM | POA: Diagnosis not present

## 2018-09-21 DIAGNOSIS — R1310 Dysphagia, unspecified: Secondary | ICD-10-CM | POA: Diagnosis present

## 2018-09-21 DIAGNOSIS — Z79899 Other long term (current) drug therapy: Secondary | ICD-10-CM | POA: Insufficient documentation

## 2018-09-21 DIAGNOSIS — K228 Other specified diseases of esophagus: Secondary | ICD-10-CM | POA: Insufficient documentation

## 2018-09-21 DIAGNOSIS — Z885 Allergy status to narcotic agent status: Secondary | ICD-10-CM | POA: Diagnosis not present

## 2018-09-21 DIAGNOSIS — I129 Hypertensive chronic kidney disease with stage 1 through stage 4 chronic kidney disease, or unspecified chronic kidney disease: Secondary | ICD-10-CM | POA: Insufficient documentation

## 2018-09-21 HISTORY — PX: ESOPHAGOGASTRODUODENOSCOPY (EGD) WITH PROPOFOL: SHX5813

## 2018-09-21 HISTORY — DX: Systemic lupus erythematosus, unspecified: M32.9

## 2018-09-21 HISTORY — DX: Reserved for concepts with insufficient information to code with codable children: IMO0002

## 2018-09-21 SURGERY — ESOPHAGOGASTRODUODENOSCOPY (EGD) WITH PROPOFOL
Anesthesia: General

## 2018-09-21 MED ORDER — PROPOFOL 10 MG/ML IV BOLUS
INTRAVENOUS | Status: DC | PRN
  Administered 2018-09-21: 30 mg via INTRAVENOUS
  Administered 2018-09-21: 20 mg via INTRAVENOUS

## 2018-09-21 MED ORDER — FENTANYL CITRATE (PF) 100 MCG/2ML IJ SOLN
INTRAMUSCULAR | Status: DC | PRN
  Administered 2018-09-21 (×2): 25 ug via INTRAVENOUS
  Administered 2018-09-21: 50 ug via INTRAVENOUS

## 2018-09-21 MED ORDER — GLYCOPYRROLATE 0.2 MG/ML IJ SOLN
INTRAMUSCULAR | Status: DC | PRN
  Administered 2018-09-21: 0.2 mg via INTRAVENOUS

## 2018-09-21 MED ORDER — MIDAZOLAM HCL 5 MG/5ML IJ SOLN
INTRAMUSCULAR | Status: DC | PRN
  Administered 2018-09-21: 2 mg via INTRAVENOUS

## 2018-09-21 MED ORDER — MIDAZOLAM HCL 2 MG/2ML IJ SOLN
INTRAMUSCULAR | Status: AC
  Filled 2018-09-21: qty 2

## 2018-09-21 MED ORDER — PROPOFOL 500 MG/50ML IV EMUL
INTRAVENOUS | Status: AC
  Filled 2018-09-21: qty 50

## 2018-09-21 MED ORDER — PROPOFOL 500 MG/50ML IV EMUL
INTRAVENOUS | Status: DC | PRN
  Administered 2018-09-21: 50 ug/kg/min via INTRAVENOUS

## 2018-09-21 MED ORDER — FENTANYL CITRATE (PF) 100 MCG/2ML IJ SOLN
INTRAMUSCULAR | Status: AC
  Filled 2018-09-21: qty 2

## 2018-09-21 MED ORDER — GLYCOPYRROLATE 0.2 MG/ML IJ SOLN
INTRAMUSCULAR | Status: AC
  Filled 2018-09-21: qty 1

## 2018-09-21 MED ORDER — LIDOCAINE HCL (PF) 2 % IJ SOLN
INTRAMUSCULAR | Status: DC | PRN
  Administered 2018-09-21: 100 mg

## 2018-09-21 MED ORDER — SODIUM CHLORIDE 0.9 % IV SOLN
INTRAVENOUS | Status: DC
  Administered 2018-09-21: 10:00:00 via INTRAVENOUS

## 2018-09-21 MED ORDER — LIDOCAINE HCL (PF) 2 % IJ SOLN
INTRAMUSCULAR | Status: AC
  Filled 2018-09-21: qty 10

## 2018-09-21 NOTE — Anesthesia Preprocedure Evaluation (Signed)
Anesthesia Evaluation  Patient identified by MRN, date of birth, ID band Patient awake    Reviewed: Allergy & Precautions, NPO status , Patient's Chart, lab work & pertinent test results, reviewed documented beta blocker date and time   Airway Mallampati: II  TM Distance: >3 FB     Dental  (+) Chipped   Pulmonary asthma , sleep apnea and Continuous Positive Airway Pressure Ventilation , COPD, Current Smoker and Patient abstained from smoking.,           Cardiovascular hypertension, Pt. on medications and Pt. on home beta blockers      Neuro/Psych PSYCHIATRIC DISORDERS Depression  Neuromuscular disease    GI/Hepatic GERD  ,  Endo/Other    Renal/GU Renal disease     Musculoskeletal  (+) Arthritis , Fibromyalgia -  Abdominal   Peds  Hematology  (+) anemia ,   Anesthesia Other Findings Lupus. TTP.  Reproductive/Obstetrics                             Anesthesia Physical Anesthesia Plan  ASA: III  Anesthesia Plan: General   Post-op Pain Management:    Induction: Intravenous  PONV Risk Score and Plan:   Airway Management Planned:   Additional Equipment:   Intra-op Plan:   Post-operative Plan:   Informed Consent: I have reviewed the patients History and Physical, chart, labs and discussed the procedure including the risks, benefits and alternatives for the proposed anesthesia with the patient or authorized representative who has indicated his/her understanding and acceptance.       Plan Discussed with: CRNA  Anesthesia Plan Comments:         Anesthesia Quick Evaluation

## 2018-09-21 NOTE — Anesthesia Postprocedure Evaluation (Signed)
Anesthesia Post Note  Patient: Bailey Dominguez  Procedure(s) Performed: ESOPHAGOGASTRODUODENOSCOPY (EGD) WITH PROPOFOL (N/A )  Patient location during evaluation: Endoscopy Anesthesia Type: General Level of consciousness: awake and alert Pain management: pain level controlled Vital Signs Assessment: post-procedure vital signs reviewed and stable Respiratory status: spontaneous breathing, nonlabored ventilation, respiratory function stable and patient connected to nasal cannula oxygen Cardiovascular status: blood pressure returned to baseline and stable Postop Assessment: no apparent nausea or vomiting Anesthetic complications: no     Last Vitals:  Vitals:   09/21/18 1028 09/21/18 1048  BP: 103/83 138/61  Pulse: 69   Resp: 19   Temp: (!) 36.3 C   SpO2: 100%     Last Pain:  Vitals:   09/21/18 1048  TempSrc:   PainSc: 0-No pain                 Elishia Kaczorowski S

## 2018-09-21 NOTE — Transfer of Care (Signed)
Immediate Anesthesia Transfer of Care Note  Patient: Bailey Dominguez  Procedure(s) Performed: ESOPHAGOGASTRODUODENOSCOPY (EGD) WITH PROPOFOL (N/A )  Patient Location: PACU  Anesthesia Type:General  Level of Consciousness: sedated  Airway & Oxygen Therapy: Patient Spontanous Breathing and Patient connected to nasal cannula oxygen  Post-op Assessment: Report given to RN and Post -op Vital signs reviewed and stable  Post vital signs: Reviewed and stable  Last Vitals:  Vitals Value Taken Time  BP 103/83 09/21/18 1028  Temp 36.3 C 09/21/18 1028  Pulse 67 09/21/18 1029  Resp 18 09/21/18 1029  SpO2 100 % 09/21/18 1029  Vitals shown include unvalidated device data.  Last Pain:  Vitals:   09/21/18 1028  TempSrc: Tympanic  PainSc: 0-No pain         Complications: No apparent anesthesia complications

## 2018-09-21 NOTE — H&P (Signed)
Outpatient short stay form Pre-procedure 09/21/2018 9:55 AM Lollie Sails MD  Primary Physician: Dr. Genene Churn  Reason for visit: EGD  History of present illness: Patient is a 63 year old female presenting today for an EGD for follow-up regards to history of pharyngo-esophageal dysphagia.  Also has a history of a antral/gastric nodule.  Much of her dyspepsia symptoms resolved after a gallbladder surgery about 2 years ago.  He does take Plavix has been held for a week.  She takes no other thinning agent.    Current Facility-Administered Medications:  .  0.9 %  sodium chloride infusion, , Intravenous, Continuous, Lollie Sails, MD, Last Rate: 20 mL/hr at 09/21/18 9470  Medications Prior to Admission  Medication Sig Dispense Refill Last Dose  . acetaminophen (TYLENOL) 650 MG CR tablet Take 1,300 mg by mouth every 8 (eight) hours as needed (for pain.).    Past Week at Unknown time  . bisoprolol (ZEBETA) 5 MG tablet Take 5 mg by mouth daily.   09/21/2018 at 0600  . Calcium Carbonate-Vit D-Min (GNP CALCIUM PLUS 600 +D PO) Take 1 tablet by mouth daily.   Past Week at Unknown time  . celecoxib (CELEBREX) 200 MG capsule Take 200 mg by mouth 2 (two) times daily as needed (for pain.).    09/17/2018 at Unknown time  . clopidogrel (PLAVIX) 75 MG tablet Take 75 mg by mouth daily.   09/14/2018 at Unknown time  . cyclobenzaprine (FLEXERIL) 10 MG tablet Take 5 mg by mouth daily as needed for muscle spasms.    Past Week at Unknown time  . hydrochlorothiazide (HYDRODIURIL) 25 MG tablet Take 25 mg by mouth daily.   09/21/2018 at 0700  . hydroxychloroquine (PLAQUENIL) 200 MG tablet Take 200 mg by mouth daily.    Past Week at Unknown time  . magnesium oxide (MAG-OX) 400 MG tablet Take 400 mg by mouth daily.    Past Week at Unknown time  . pantoprazole (PROTONIX) 40 MG tablet Take 40 mg by mouth daily.   09/20/2018 at 0800  . pravastatin (PRAVACHOL) 40 MG tablet Take 40 mg by mouth at bedtime.   09/20/2018  at 1800  . spironolactone (ALDACTONE) 25 MG tablet Take 25 mg by mouth daily.   Past Month at Unknown time  . vitamin B-12 (CYANOCOBALAMIN) 500 MCG tablet Take 500 mcg by mouth daily.   Past Week at Unknown time  . Vitamin D, Ergocalciferol, (DRISDOL) 50000 units CAPS capsule Take 50,000 Units by mouth every 14 (fourteen) days.    Past Week at Unknown time  . albuterol (PROVENTIL HFA;VENTOLIN HFA) 108 (90 Base) MCG/ACT inhaler Inhale 2 puffs into the lungs every 6 (six) hours as needed for wheezing or shortness of breath.     Marland Kitchen amLODipine (NORVASC) 10 MG tablet Take 10 mg by mouth daily.      . clindamycin (CLEOCIN T) 1 % external solution Apply 1 application topically 2 (two) times daily as needed (for skin boils).      Marland Kitchen HYDROcodone-acetaminophen (NORCO) 5-325 MG tablet Take 1-2 tablets by mouth every 4 (four) hours as needed for moderate pain. 12 tablet 0   . tiZANidine (ZANAFLEX) 4 MG capsule Take 4 mg by mouth 3 (three) times daily as needed for muscle spasms.     Marland Kitchen zolpidem (AMBIEN) 10 MG tablet Take 5 mg by mouth at bedtime as needed for sleep.        Allergies  Allergen Reactions  . Codeine Itching  Past Medical History:  Diagnosis Date  . Allergic rhinitis   . ASCVD (arteriosclerotic cardiovascular disease) 04/14/2013   Overview:  A. CVA - 2008 B. Hyperlipidemia C. Cigarettes  . Asthma   . B12 deficiency   . Chickenpox   . Chronic kidney disease, stage III (moderate) ([place]) 10/07/2013  . Connective tissue disease ([place])   . COPD (chronic obstructive pulmonary disease) ([place])   . COPD, mild ([place]) 10/07/2013  . Depression   . Dysphagia   . Fibromyalgia   . GERD (gastroesophageal reflux disease)   . H/O degenerative disc disease   . History of embolic stroke without residual deficits 01/05/2007  . Hypercholesterolemia 10/07/2013  . Hypertension   . Impingement syndrome, shoulder 10/07/2013   right  . Lupus ([place])   . Nocturnal muscle cramps   . Obstructive sleep apnea on CPAP  10/07/2013   Overview:  9 cm  . OSA on CPAP    no longer wears cpap  . Osteoarthritis    lupus  . Pain    intermittent cheat pain x 1 mo since grand daughter passes from brain tumor  . Primary osteoarthritis of right knee 08/23/2013  . Stroke [facility])    2008  no residual  . Tear of medial cartilage or meniscus of knee, current 08/23/2013   right  . 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 Vitamin D deficiency     Review of systems:      Physical Exam    Heart and lungs: Regular rate and rhythm without rub or gallop lungs are bilaterally clear    HEENT: Normocephalic atraumatic eyes are anicteric    Other:    Pertinant exam for procedure: Soft nontender nondistended bowel sounds positive normoactive    Planned proceedures: EGD and indicated procedures. I have discussed the risks benefits and complications of procedures to include not limited to bleeding, infection, perforation and the risk of sedation and the patient wishes to proceed.    Lollie Sails, MD Gastroenterology 09/21/2018  9:55 AM

## 2018-09-21 NOTE — Op Note (Signed)
[facility] Gastroenterology Patient Name: Bailey Dominguez Procedure Date: 09/21/2018 9:57 AM MRN: [id number] Account #: [id number] Date of Birth: 1955/10/22 Admit Type: Outpatient Age: 63 Room: [facility] ENDO ROOM 3 Gender: Female Note Status: Finalized Procedure:            Upper GI endoscopy Indications:          Dyspepsia, Dysphagia, follow up gastric nodule Providers:            Lollie Sails, MD Referring MD:         Christena Flake. Raechel Ache, MD (Referring MD) Medicines:            Monitored Anesthesia Care Complications:        No immediate complications. Procedure:            Pre-Anesthesia Assessment:                       - ASA Grade Assessment: III - A patient with severe                        systemic disease.                       After obtaining informed consent, the endoscope was                        passed under direct vision. Throughout the procedure,                        the patient's blood pressure, pulse, and oxygen                        saturations were monitored continuously. The Endoscope                        was introduced through the mouth, and advanced to the                        third part of duodenum. The upper GI endoscopy was                        accomplished without difficulty. The patient tolerated                        the procedure well. Findings:      The Z-line was irregular. Biopsies were taken with a cold forceps for       histology.      A single 6 mm submucosal papule (nodule) with no bleeding and no       stigmata of recent bleeding was found on the anterior wall of the       gastric antrum. Biopsies were taken with a cold forceps for histology.      A single 15 mm submucosal papule (nodule) with no bleeding and       irritation of the mucosa noted on the protuberance of the       nodule/mucosal, was found on the posterior wall of the gastric antrum.       Biopsies were taken with a cold forceps for histology.      The  cardia and gastric fundus were normal on retroflexion.      The exam was otherwise  without abnormality.      Biopsies were taken with a cold forceps in the gastric body and in the       gastric antrum for Helicobacter pylori testing.      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 Atypical       lesion on the tip of the tongue, apparently stable from the last exam. Impression:           - Z-line irregular. Biopsied.                       - A single submucosal papule (nodule) found in the                        stomach. Biopsied.                       - A single submucosal papule (nodule) found in the                        stomach. Biopsied.                       - The examination was otherwise normal.                       - Biopsies were taken with a cold forceps for                        Helicobacter pylori testing. Recommendation:       - Continue present medications.                       - Await pathology results.                       - will need EUS of gastric nodules if biopsies do not                        identify submucosa.                       - Return to GI clinic in 2 weeks. Procedure Code(s):    --- Professional ---                       [phone], Esophagogastroduodenoscopy, flexible, transoral;                        with biopsy, single or multiple Diagnosis Code(s):    --- Professional ---                       K22.8, Other specified diseases of esophagus                       K31.89, Other diseases of stomach and duodenum                       R10.13, Epigastric pain                       R13.10, Dysphagia, unspecified CPT copyright 2019 [employer]. All rights reserved. The codes documented in this report are preliminary and upon coder review  may  be revised to meet current compliance requirements. Lollie Sails, MD 09/21/2018 10:34:12 AM This report has been signed electronically. Number of Addenda: 0 Note Initiated On:  09/21/2018 9:57 AM Total Procedure Duration: 0 hours 17 minutes 16 seconds       [facility]

## 2018-09-21 NOTE — Anesthesia Post-op Follow-up Note (Signed)
Anesthesia QCDR form completed.        

## 2018-09-22 ENCOUNTER — Encounter: Payer: Self-pay | Admitting: Gastroenterology

## 2018-09-22 LAB — SURGICAL PATHOLOGY

## 2018-10-01 ENCOUNTER — Other Ambulatory Visit: Payer: Self-pay | Admitting: Orthopedic Surgery

## 2018-10-01 DIAGNOSIS — M19011 Primary osteoarthritis, right shoulder: Secondary | ICD-10-CM

## 2018-10-11 ENCOUNTER — Ambulatory Visit
Admission: RE | Admit: 2018-10-11 | Discharge: 2018-10-11 | Disposition: A | Discharge status: Home / Self Care | Payer: Medicare Other | Source: Ambulatory Visit | Attending: Orthopedic Surgery | Admitting: Orthopedic Surgery

## 2018-10-11 ENCOUNTER — Other Ambulatory Visit: Payer: Self-pay

## 2018-10-11 DIAGNOSIS — M19011 Primary osteoarthritis, right shoulder: Secondary | ICD-10-CM

## 2018-10-23 ENCOUNTER — Other Ambulatory Visit
Admission: RE | Admit: 2018-10-23 | Discharge: 2018-10-23 | Disposition: A | Discharge status: Home / Self Care | Payer: Medicare Other | Source: Ambulatory Visit | Attending: Internal Medicine | Admitting: Internal Medicine

## 2018-10-23 ENCOUNTER — Other Ambulatory Visit: Payer: Self-pay

## 2018-10-23 DIAGNOSIS — Z20828 Contact with and (suspected) exposure to other viral communicable diseases: Secondary | ICD-10-CM | POA: Diagnosis not present

## 2018-10-23 DIAGNOSIS — Z01812 Encounter for preprocedural laboratory examination: Secondary | ICD-10-CM | POA: Diagnosis present

## 2018-10-23 LAB — SARS CORONAVIRUS 2 (TAT 6-24 HRS): SARS Coronavirus 2: NEGATIVE

## 2018-10-27 ENCOUNTER — Ambulatory Visit: Payer: Medicare Other | Attending: Neurology

## 2018-10-29 ENCOUNTER — Ambulatory Visit: Payer: Medicare Other | Attending: Neurology

## 2018-10-29 DIAGNOSIS — G4733 Obstructive sleep apnea (adult) (pediatric): Secondary | ICD-10-CM | POA: Diagnosis present

## 2018-11-03 ENCOUNTER — Other Ambulatory Visit: Payer: Self-pay

## 2019-11-26 ENCOUNTER — Other Ambulatory Visit: Payer: Self-pay | Admitting: Internal Medicine

## 2019-11-26 DIAGNOSIS — Z1231 Encounter for screening mammogram for malignant neoplasm of breast: Secondary | ICD-10-CM

## 2020-01-05 ENCOUNTER — Other Ambulatory Visit: Payer: Self-pay | Admitting: Orthopedic Surgery

## 2020-01-05 DIAGNOSIS — M19011 Primary osteoarthritis, right shoulder: Secondary | ICD-10-CM

## 2020-01-27 ENCOUNTER — Ambulatory Visit
Admission: RE | Admit: 2020-01-27 | Discharge: 2020-01-27 | Disposition: A | Discharge status: Home / Self Care | Payer: Medicare Other | Source: Ambulatory Visit | Attending: Orthopedic Surgery | Admitting: Orthopedic Surgery

## 2020-01-27 ENCOUNTER — Other Ambulatory Visit: Payer: Self-pay

## 2020-01-27 DIAGNOSIS — M19011 Primary osteoarthritis, right shoulder: Secondary | ICD-10-CM | POA: Insufficient documentation

## 2020-02-09 ENCOUNTER — Other Ambulatory Visit: Payer: Self-pay | Admitting: Orthopedic Surgery

## 2020-02-16 ENCOUNTER — Encounter: Payer: Self-pay | Admitting: Orthopedic Surgery

## 2020-02-16 ENCOUNTER — Other Ambulatory Visit: Payer: Self-pay

## 2020-02-16 ENCOUNTER — Encounter
Admission: RE | Admit: 2020-02-16 | Discharge: 2020-02-16 | Disposition: A | Discharge status: Home / Self Care | Payer: Medicare Other | Source: Ambulatory Visit | Attending: Orthopedic Surgery | Admitting: Orthopedic Surgery

## 2020-02-16 DIAGNOSIS — Z01818 Encounter for other preprocedural examination: Secondary | ICD-10-CM | POA: Insufficient documentation

## 2020-02-16 LAB — CBC WITH DIFFERENTIAL/PLATELET
Abs Immature Granulocytes: 0.02 10*3/uL (ref 0.00–0.07)
Basophils Absolute: 0 10*3/uL (ref 0.0–0.1)
Basophils Relative: 0 %
Eosinophils Absolute: 0.1 10*3/uL (ref 0.0–0.5)
Eosinophils Relative: 2 %
HCT: 38.1 % (ref 36.0–46.0)
Hemoglobin: 11.8 g/dL — ABNORMAL LOW (ref 12.0–15.0)
Immature Granulocytes: 0 %
Lymphocytes Relative: 38 %
Lymphs Abs: 2.1 10*3/uL (ref 0.7–4.0)
MCH: 27.8 pg (ref 26.0–34.0)
MCHC: 31 g/dL (ref 30.0–36.0)
MCV: 89.6 fL (ref 80.0–100.0)
Monocytes Absolute: 0.5 10*3/uL (ref 0.1–1.0)
Monocytes Relative: 9 %
Neutro Abs: 2.9 10*3/uL (ref 1.7–7.7)
Neutrophils Relative %: 51 %
Platelets: 271 10*3/uL (ref 150–400)
RBC: 4.25 MIL/uL (ref 3.87–5.11)
RDW: 13.2 % (ref 11.5–15.5)
WBC: 5.6 10*3/uL (ref 4.0–10.5)
nRBC: 0 % (ref 0.0–0.2)

## 2020-02-16 LAB — COMPREHENSIVE METABOLIC PANEL
ALT: 49 U/L — ABNORMAL HIGH (ref 0–44)
AST: 43 U/L — ABNORMAL HIGH (ref 15–41)
Albumin: 4.1 g/dL (ref 3.5–5.0)
Alkaline Phosphatase: 65 U/L (ref 38–126)
Anion gap: 10 (ref 5–15)
BUN: 18 mg/dL (ref 8–23)
CO2: 25 mmol/L (ref 22–32)
Calcium: 9 mg/dL (ref 8.9–10.3)
Chloride: 105 mmol/L (ref 98–111)
Creatinine, Ser: 1.13 mg/dL — ABNORMAL HIGH (ref 0.44–1.00)
GFR, Estimated: 55 mL/min — ABNORMAL LOW (ref >60)
Glucose, Bld: 119 mg/dL — ABNORMAL HIGH (ref 70–99)
Potassium: 3.6 mmol/L (ref 3.5–5.1)
Sodium: 140 mmol/L (ref 135–145)
Total Bilirubin: 0.5 mg/dL (ref 0.3–1.2)
Total Protein: 8 g/dL (ref 6.5–8.1)

## 2020-02-16 LAB — URINALYSIS, ROUTINE W REFLEX MICROSCOPIC
Bilirubin Urine: NEGATIVE
Glucose, UA: NEGATIVE mg/dL
Hgb urine dipstick: NEGATIVE
Ketones, ur: NEGATIVE mg/dL
Leukocytes,Ua: NEGATIVE
Nitrite: NEGATIVE
Protein, ur: NEGATIVE mg/dL
Specific Gravity, Urine: 1.019 (ref 1.005–1.030)
pH: 5 (ref 5.0–8.0)

## 2020-02-16 LAB — SURGICAL PCR SCREEN
MRSA, PCR: NEGATIVE
Staphylococcus aureus: NEGATIVE

## 2020-02-16 LAB — TYPE AND SCREEN
ABO/RH(D): A POS
Antibody Screen: NEGATIVE

## 2020-02-16 NOTE — Patient Instructions (Addendum)
Your procedure is scheduled on:02-21-20 MONDAY Report to the Registration Desk on the 1st floor of the [facility]-Then proceed to the 2nd floor Surgery Desk in the [facility] To find out your arrival time, please call [phone] between 1PM - 3PM on:02-18-20 FRIDAY  REMEMBER: Instructions that are not followed completely may result in serious medical risk, up to and including death; or upon the discretion of your surgeon and anesthesiologist your surgery may need to be rescheduled.  Do not eat food after midnight the night before surgery.  No gum chewing, lozengers or hard candies.  You may however, drink CLEAR liquids up to 2 hours before you are scheduled to arrive for your surgery. Do not drink anything within 2 hours of your scheduled arrival time.  Clear liquids include: - water  - apple juice without pulp - gatorade  - black coffee or tea (Do NOT add milk or creamers to the coffee or tea) Do NOT drink anything that is not on this list.   In addition, your doctor has ordered for you to drink the provided  Ensure Pre-Surgery Clear Carbohydrate Drink  Drinking this carbohydrate drink up to two hours before surgery helps to reduce insulin resistance and improve patient outcomes. Please complete drinking 2 hours prior to scheduled arrival time.  TAKE THESE MEDICATIONS THE MORNING OF SURGERY WITH A SIP OF WATER: -NORVASC (AMLODIPINE) -BISOPROLOL (ZEBETA) -MAGNESIUM OXIDE -PROTONIX (PANTOPRAZOLE)-take one the night before and one on the morning of surgery - helps to prevent nausea after surgery.)  Use ALBUTEROL INHALER on the day of surgery and bring to the hospital  Follow recommendations from Cardiologist, Pulmonologist or PCP regarding stopping Aspirin, Coumadin, Plavix, Eliquis, Pradaxa, or Pletal-CALL DR PATEL'S OFFICE ABOUT WHEN TO STOP YOUR PLAVIX [phone]  One week prior to surgery: Stop Anti-inflammatories (NSAIDS) such as Advil, Aleve, Ibuprofen, Motrin,  Naproxen, Naprosyn and Aspirin based products such as Excedrin, Goodys Powder, BC Powder-OK TO TAKE TYLENOL/HYDROCODONE IF  NEEDED-OK TO TAKE CELEBREX ALSO BUT DO NOT TAKE THE DAY OF SURGERY  Stop ANY OVER THE COUNTER supplements until after surgery. (However, you may continue taking Vitamin D, Vitamin B, and multivitamin up until the day before surgery.)  No Alcohol for 24 hours before or after surgery.  No Smoking including e-cigarettes for 24 hours prior to surgery.  No chewable tobacco products for at least 6 hours prior to surgery.  No nicotine patches on the day of surgery.  Do not use any "recreational" drugs for at least a week prior to your surgery.  Please be advised that the combination of cocaine and anesthesia may have negative outcomes, up to and including death. If you test positive for cocaine, your surgery will be cancelled.  On the morning of surgery brush your teeth with toothpaste and water, you may rinse your mouth with mouthwash if you wish. Do not swallow any toothpaste or mouthwash.  Do not wear jewelry, make-up, hairpins, clips or nail polish.  Do not wear lotions, powders, or perfumes.   Do not shave body from the neck down 48 hours prior to surgery just in case you cut yourself which could leave a site for infection.  Also, freshly shaved skin may become irritated if using the CHG soap.  Contact lenses, hearing aids and dentures may not be worn into surgery.  Do not bring valuables to the hospital. [facility] is not responsible for any missing/lost belongings or valuables.   BRING CPAP MACHINE TO THE HOSPITAL THE DAY OF  SURGERY  Use CHG Soap as directed on instruction sheet.  Total Shoulder Arthroplasty:  use Benzolyl Peroxide 5% Gel as directed on instruction sheet.   Notify your doctor if there is any change in your medical condition (cold, fever, infection).  Wear comfortable clothing (specific to your surgery type) to the hospital.  Plan for  stool softeners for home use; pain medications have a tendency to cause constipation. You can also help prevent constipation by eating foods high in fiber such as fruits and vegetables and drinking plenty of fluids as your diet allows.  After surgery, you can help prevent lung complications by doing breathing exercises.  Take deep breaths and cough every 1-2 hours. Your doctor may order a device called an Incentive Spirometer to help you take deep breaths. When coughing or sneezing, hold a pillow firmly against your incision with both hands. This is called "splinting." Doing this helps protect your incision. It also decreases belly discomfort.  If you are being admitted to the hospital overnight, leave your suitcase in the car. After surgery it may be brought to your room.  If you are being discharged the day of surgery, you will not be allowed to drive home. You will need a responsible adult (18 years or older) to drive you home and stay with you that night.   If you are taking public transportation, you will need to have a responsible adult (18 years or older) with you. Please confirm with your physician that it is acceptable to use public transportation.   Please call the [facility] at [phone] if you have any questions about these instructions.  Visitation Policy:  Patients undergoing a surgery or procedure may have one family member or support person with them as long as that person is not COVID-19 positive or experiencing its symptoms.  That person may remain in the waiting area during the procedure.  Inpatient Visitation:    Visiting hours are 7 a.m. to 8 p.m. Patients will be allowed one visitor. The visitor may change daily. The visitor must pass COVID-19 screenings, use hand sanitizer when entering and exiting the patient's room and wear a mask at all times, including in the patient's room. Patients must also wear a mask when staff or their visitor are  in the room. Masking is required regardless of vaccination status. Systemwide, no visitors 17 or younger.

## 2020-02-16 NOTE — Progress Notes (Signed)
[facility] Perioperative Services  Pre-Admission/Anesthesia Testing Clinical Review  Date: 02/17/20  Patient Demographics:  Name: Bailey Dominguez DOB:   Jul 27, 1955 MRN:   [id number]  Planned Surgical Procedure(s):    Case: [id number] Date/Time: 02/21/20 1145   Procedure: Right total shoulder arthroplasty, biceps tenodesis - Reche Dixon to Assist (Right Shoulder) - Reche Dixon to Assist   Anesthesia type: Choice   Pre-op diagnosis: Osteoarthritis of glenohumeral joint, right M19.011   Location: [facility] OR ROOM 08 / [facility] FOR ANESTHESIA GROUP   Surgeons: Leim Fabry, MD    NOTE: Available PAT nursing documentation and vital signs have been reviewed. Clinical nursing staff has updated patient's PMH/PSHx, current medication list, and drug allergies/intolerances to ensure comprehensive history available to assist in medical decision making as it pertains to the aforementioned surgical procedure and anticipated anesthetic course.   Clinical Discussion:  Bailey Dominguez is a 65 y.o. female who is submitted for pre-surgical anesthesia review and clearance prior to her undergoing the above procedure.Patient is a Current Smoker (12.5 pack years). Pertinent PMH includes: ASCVD, CVA (2008), HTN, HLD, COPD, asthma, OSAH (requires nocturnal PAP therapy), CKD-III, SLE, TTP, OA, fibromyalgia, DDD, depression  Patient is followed by cardiology Ubaldo Glassing, MD). She was last seen in the cardiology clinic on 12/08/2019; notes reviewed.  Patient was seen for acute visit following an episode of retrosternal chest pain that resolved following a single dose of SL NTG.  Patient developed an high stress situation; patient was at her daughter-in-law's funeral.  Patient denied any associated shortness of breath, diaphoresis, radiation, nausea, or vomiting.  Patient has not experienced any PND, orthopnea, palpitations, vertiginous symptoms, or presyncope/syncope.  Patient with intermittent peripheral edema  associated with prolonged standing.  ECG in the office revealed normal sinus rhythm at a rate of 63 bpm.  Echocardiogram performed on 05/29/2017 revealed normal left ventricular systolic function with mild LVH and mild valvular insufficiency; LVEF >55%.  Myocardial perfusion imaging study revealed no evidence of reversible ischemia; LVEF 70% (see full interpretation of cardiovascular testing below).  Patient on GDMT for her HTN and HLD diagnoses.  Blood pressure well controlled at 110/72 on currently prescribed CCB, beta-blocker, and diuretic therapies.  Patient is on a statin for HLD.  Patient takes daily antiplatelet therapy (clopidogrel) for risk factor modification for her known ASCVD.  No changes were made to patient's medication regimen.  Patient to follow-up with outpatient cardiology in 1 year or sooner if needed.  Patient is scheduled to undergo an elective orthopedic procedure on 02/21/2020 with Dr. Leim Fabry.  Given patient's past medical history significant for cardiovascular disease, presurgical cardiac clearance was sought by the PAT team.  Per cardiology, "this patient is optimized for surgery and may proceed with the planned procedural course with a LOW risk stratification". Again, this patient is on daily antiplatelet therapy. She has been instructed on recommendations for holding her clopidogrel for 5 days prior to her procedure with plans to resume as soon possible postoperatively. The patient has been instructed that her last dose of her anticoagulant will be on 02/15/2020.  She denies previous perioperative complications with anesthesia. She underwent a MAC anesthetic course at [facility] (ASA III) in 12/2018 with no documented complications.  Patient has a history of cervical DDD.  Results from most recent imaging included below for review by anesthesia team in the event that neuraxial anesthetic course is considered as part of patient plan of care.  Vitals with BMI 02/16/2020 10/11/2018  09/21/2018  Height - - -  Weight - 200 lbs -  BMI - 45.80 -  Systolic 998 - 338  Diastolic 69 - 61  Pulse 64 - -    Providers/Specialists:   NOTE: Primary physician provider listed below. Patient may have been seen by APP or partner within same practice.   PROVIDER ROLE / SPECIALTY LAST Rossie Muskrat, MD Orthopedics (Surgeon)  02/10/2020  Ezequiel Kayser, MD Primary Care Provider  11/26/2019  Bartholome Bill, MD Cardiology  12/08/2019   Allergies:  Codeine  Current Home Medications:   No current facility-administered medications for this encounter.   Marland Kitchen acetaminophen (TYLENOL) 650 MG CR tablet  . albuterol (PROVENTIL HFA;VENTOLIN HFA) 108 (90 Base) MCG/ACT inhaler  . amLODipine (NORVASC) 10 MG tablet  . bisoprolol (ZEBETA) 5 MG tablet  . Calcium Carbonate-Vit D-Min (GNP CALCIUM PLUS 600 +D PO)  . celecoxib (CELEBREX) 200 MG capsule  . clindamycin (CLEOCIN T) 1 % external solution  . clopidogrel (PLAVIX) 75 MG tablet  . cyclobenzaprine (FLEXERIL) 10 MG tablet  . hydrochlorothiazide (HYDRODIURIL) 25 MG tablet  . HYDROcodone-acetaminophen (NORCO) 5-325 MG tablet  . hydroxychloroquine (PLAQUENIL) 200 MG tablet  . magnesium oxide (MAG-OX) 400 MG tablet  . nitroGLYCERIN (NITROSTAT) 0.4 MG SL tablet  . pantoprazole (PROTONIX) 40 MG tablet  . pravastatin (PRAVACHOL) 40 MG tablet  . spironolactone (ALDACTONE) 25 MG tablet  . tiZANidine (ZANAFLEX) 4 MG capsule  . vitamin B-12 (CYANOCOBALAMIN) 500 MCG tablet  . Vitamin D, Ergocalciferol, (DRISDOL) 50000 units CAPS capsule   History:   Past Medical History:  Diagnosis Date  . Allergic rhinitis   . ASCVD (arteriosclerotic cardiovascular disease) 04/14/2013   Overview:  A. CVA - 2008 B. Hyperlipidemia C. Cigarettes  . Asthma   . B12 deficiency   . Chickenpox   . Chronic kidney disease, stage III (moderate) ([place]) 10/07/2013  . Connective tissue disease ([place])   . COPD, mild ([place]) 10/07/2013  . Depression   . Dysphagia   .  Fibromyalgia   . GERD (gastroesophageal reflux disease)   . H/O degenerative disc disease   . History of embolic stroke without residual deficits 01/05/2007  . Hypercholesterolemia 10/07/2013  . Hypertension   . Impingement syndrome, shoulder 10/07/2013   right  . Lupus ([place])   . Nocturnal muscle cramps   . Obstructive sleep apnea on CPAP 10/07/2013   Overview:  9 cm  . OSA on CPAP   . Osteoarthritis    lupus  . Pain    intermittent cheat pain x 1 mo since grand daughter passes from brain tumor  . Primary osteoarthritis of right knee 08/23/2013  . Stroke [facility])    2008  no residual  . Tear of medial cartilage or meniscus of knee, current 08/23/2013   right  . Thrombotic thrombocytopenic purpura   . TTP (thrombotic thrombocytopenic purpura) 10/07/2013   Overview:  S/P plasmapheresis 04/2004 [facility]  . Vitamin D deficiency    Past Surgical History:  Procedure Laterality Date  . ABDOMINAL HYSTERECTOMY    . CHOLECYSTECTOMY N/A 07/12/2016   Procedure: LAPAROSCOPIC CHOLECYSTECTOMY WITH INTRAOPERATIVE CHOLANGIOGRAM;  Surgeon: Leonie Green, MD;  Location: [facility] ORS;  Service: General;  Laterality: N/A;  . COLONOSCOPY WITH PROPOFOL N/A 03/17/2017   Procedure: COLONOSCOPY WITH PROPOFOL;  Surgeon: Lollie Sails, MD;  Location: [facility] ENDOSCOPY;  Service: Endoscopy;  Laterality: N/A;  . ESOPHAGOGASTRODUODENOSCOPY (EGD) WITH PROPOFOL N/A 08/15/2015   Procedure: ESOPHAGOGASTRODUODENOSCOPY (EGD) WITH PROPOFOL;  Surgeon: Lollie Sails, MD;  Location: [facility] ENDOSCOPY;  Service: Endoscopy;  Laterality: N/A;  . ESOPHAGOGASTRODUODENOSCOPY (EGD) WITH PROPOFOL N/A 03/17/2017   Procedure: ESOPHAGOGASTRODUODENOSCOPY (EGD) WITH PROPOFOL;  Surgeon: Lollie Sails, MD;  Location: [facility] ENDOSCOPY;  Service: Endoscopy;  Laterality: N/A;  . ESOPHAGOGASTRODUODENOSCOPY (EGD) WITH PROPOFOL N/A 09/21/2018   Procedure: ESOPHAGOGASTRODUODENOSCOPY (EGD) WITH PROPOFOL;  Surgeon: Lollie Sails, MD;  Location:  [facility] ENDOSCOPY;  Service: Endoscopy;  Laterality: N/A;  . JOINT REPLACEMENT Right 2016   shoulder bil. , right knee  . REPLACEMENT TOTAL KNEE Right   . ROTATOR CUFF REPAIR Left   . ROTATOR CUFF REPAIR Right    Family History  Problem Relation Age of Onset  . Kidney cancer Neg Hx   . Prostate cancer Neg Hx   . Bladder Cancer Neg Hx    Social History   Tobacco Use  . Smoking status: Current Every Day Smoker    Packs/day: 0.25    Years: 50.00    Pack years: 12.50    Types: Cigarettes  . Smokeless tobacco: Never Used  Vaping Use  . Vaping Use: Never used  Substance Use Topics  . Alcohol use: No  . Drug use: No    Pertinent Clinical Results:  LABS: Labs reviewed: Acceptable for surgery.  Hospital Outpatient Visit on 02/16/2020  Component Date Value Ref Range Status  . MRSA, PCR 02/16/2020 NEGATIVE  NEGATIVE Final  . Staphylococcus aureus 02/16/2020 NEGATIVE  NEGATIVE Final   Comment: (NOTE) The Xpert SA Assay (FDA approved for NASAL specimens in patients 34 years of age and older), is one component of a comprehensive surveillance program. It is not intended to diagnose infection nor to guide or monitor treatment. Performed at [facility], [address] [place], [place] [address]   . WBC 02/16/2020 5.6  4.0 - 10.5 K/uL Final  . RBC 02/16/2020 4.25  3.87 - 5.11 MIL/uL Final  . Hemoglobin 02/16/2020 11.8* 12.0 - 15.0 g/dL Final  . HCT 02/16/2020 38.1  36.0 - 46.0 % Final  . MCV 02/16/2020 89.6  80.0 - 100.0 fL Final  . MCH 02/16/2020 27.8  26.0 - 34.0 pg Final  . MCHC 02/16/2020 31.0  30.0 - 36.0 g/dL Final  . RDW 02/16/2020 13.2  11.5 - 15.5 % Final  . Platelets 02/16/2020 271  150 - 400 K/uL Final  . nRBC 02/16/2020 0.0  0.0 - 0.2 % Final  . Neutrophils Relative % 02/16/2020 51  % Final  . Neutro Abs 02/16/2020 2.9  1.7 - 7.7 K/uL Final  . Lymphocytes Relative 02/16/2020 38  % Final  . Lymphs Abs 02/16/2020 2.1  0.7 - 4.0 K/uL Final  . Monocytes  Relative 02/16/2020 9  % Final  . Monocytes Absolute 02/16/2020 0.5  0.1 - 1.0 K/uL Final  . Eosinophils Relative 02/16/2020 2  % Final  . Eosinophils Absolute 02/16/2020 0.1  0.0 - 0.5 K/uL Final  . Basophils Relative 02/16/2020 0  % Final  . Basophils Absolute 02/16/2020 0.0  0.0 - 0.1 K/uL Final  . Immature Granulocytes 02/16/2020 0  % Final  . Abs Immature Granulocytes 02/16/2020 0.02  0.00 - 0.07 K/uL Final   Performed at [facility], [address], [place], [place] [address]  . Sodium 02/16/2020 140  135 - 145 mmol/L Final  . Potassium 02/16/2020 3.6  3.5 - 5.1 mmol/L Final  . Chloride 02/16/2020 105  98 - 111 mmol/L Final  . CO2 02/16/2020 25  22 - 32 mmol/L Final  .  Glucose, Bld 02/16/2020 119* 70 - 99 mg/dL Final   Glucose reference range applies only to samples taken after fasting for at least 8 hours.  . BUN 02/16/2020 18  8 - 23 mg/dL Final  . Creatinine, Ser 02/16/2020 1.13* 0.44 - 1.00 mg/dL Final  . Calcium 02/16/2020 9.0  8.9 - 10.3 mg/dL Final  . Total Protein 02/16/2020 8.0  6.5 - 8.1 g/dL Final  . Albumin 02/16/2020 4.1  3.5 - 5.0 g/dL Final  . AST 02/16/2020 43* 15 - 41 U/L Final  . ALT 02/16/2020 49* 0 - 44 U/L Final  . Alkaline Phosphatase 02/16/2020 65  38 - 126 U/L Final  . Total Bilirubin 02/16/2020 0.5  0.3 - 1.2 mg/dL Final  . GFR, Estimated 02/16/2020 55* >60 mL/min Final   Comment: (NOTE) Calculated using the CKD-EPI Creatinine Equation (2021)   . Anion gap 02/16/2020 10  5 - 15 Final   Performed at [facility], [address] [place], [place] [address]  . Color, Urine 02/16/2020 YELLOW* YELLOW Final  . APPearance 02/16/2020 HAZY* CLEAR Final  . Specific Gravity, Urine 02/16/2020 1.019  1.005 - 1.030 Final  . pH 02/16/2020 5.0  5.0 - 8.0 Final  . Glucose, UA 02/16/2020 NEGATIVE  NEGATIVE mg/dL Final  . Hgb urine dipstick 02/16/2020 NEGATIVE  NEGATIVE Final  . Bilirubin Urine 02/16/2020 NEGATIVE  NEGATIVE Final  . Ketones, ur  02/16/2020 NEGATIVE  NEGATIVE mg/dL Final  . Protein, ur 02/16/2020 NEGATIVE  NEGATIVE mg/dL Final  . Nitrite 02/16/2020 NEGATIVE  NEGATIVE Final  . Chalmers Guest 02/16/2020 NEGATIVE  NEGATIVE Final   Performed at [facility], [address] [place], [place] [address]  . ABO/RH(D) 02/16/2020 A POS   Final  . Antibody Screen 02/16/2020 NEG   Final  . Sample Expiration 02/16/2020 03/01/2020,2359   Final  . Extend sample reason 02/16/2020    Final                   Value:NO TRANSFUSIONS OR PREGNANCY IN THE PAST 3 MONTHS Performed at [facility], [address] [place], [place] [address]     ECG: Date: 02/16/2020 Time ECG obtained: 1342 PM Rate: 63 bpm Rhythm: normal sinus Axis (leads I and aVF): Normal Intervals: PR 168 ms. QRS 76 ms. QTc 458 ms. ST segment and T wave changes: Nonspecific ST and T wave abnormalities  Comparison: Similar to previous tracing obtained on 07/05/2016   IMAGING / PROCEDURES: MRI SHOULDER RIGHT WO CONTRAST performed on 01/27/2020 1. Postsurgical changes from prior rotator cuff repair 2. Tendinosis and attenuation of the supraspinatus and infraspinatus tendons without evidence of new or recurrent tear 3. Severe osteoarthritis of the right glenohumeral joint with small joint effusion and synovitis  CT SHOULDER RIGHT WO CONTRAST performed on 01/27/2020 1. Prior rotator cuff repair 2. No fluid collection or hematoma 3. Severe osteoarthritis of the right glenohumeral joint  LEXISCAN performed on 05/29/2017 1. LVEF 70% 2. Regional wall motion reveals normal myocardial thickening and wall motion 3. Left jugular cavity size normal 4. No artifacts noted 5. No evidence of reversible ischemia 6. Low risk study  ECHOCARDIOGRAM performed on 05/29/2017 1. LVEF >55% 2. Normal left ventricular systolic function 3. Normal right ventricular systolic function 4. Mild MR and TR, trivial PR, no AR 5. No aortic stenosis 6. Mild LVH 7. No  evidence of pericardial effusion  MRI CERVICAL SPINE WO CONTRAST performed on 03/29/2015 1. New mild spinal stenosis and mid left neural  foraminal stenosis at C4-5 due to right paracentral disc protrusion and uncovertebral and facet disease. 2. New mild to moderate right neuroforaminal stenosis and borderline spinal stenosis at C3-4 3. Progressive right-sided C3-4 facet arthritis with mild to moderate edema 4. Unchanged mild spinal stenosis at C6-7  Impression and Plan:  Bailey Dominguez has been referred for pre-anesthesia review and clearance prior to her undergoing the planned anesthetic and procedural courses. Available labs, pertinent testing, and imaging results were personally reviewed by me. This patient has been appropriately cleared by cardiology with an overall LOW risk for significant perioperative cardiovascular complications.   Based on clinical review performed today (02/17/20), barring any significant acute changes in the patient's overall condition, it is anticipated that she will be able to proceed with the planned surgical intervention. Any acute changes in clinical condition may necessitate her procedure being postponed and/or cancelled. Pre-surgical instructions were reviewed with the patient during her PAT appointment and questions were fielded by PAT clinical staff.  Honor Loh, MSN, APRN, FNP-C, CEN [facility]  Peri-operative Services Nurse Practitioner Phone: [phone] 02/17/20 10:11 AM  NOTE: This note has been prepared using Dragon dictation software. Despite my best ability to proofread, there is always the potential that unintentional transcriptional errors may still occur from this process.

## 2020-02-18 ENCOUNTER — Other Ambulatory Visit
Admission: RE | Admit: 2020-02-18 | Discharge: 2020-02-18 | Disposition: A | Discharge status: Home / Self Care | Payer: Medicare Other | Source: Ambulatory Visit | Attending: Orthopedic Surgery | Admitting: Orthopedic Surgery

## 2020-02-18 ENCOUNTER — Encounter
Admission: RE | Admit: 2020-02-18 | Discharge: 2020-02-18 | Disposition: A | Discharge status: Home / Self Care | Payer: Medicare Other | Source: Ambulatory Visit | Attending: Orthopedic Surgery | Admitting: Orthopedic Surgery

## 2020-02-18 ENCOUNTER — Other Ambulatory Visit: Payer: Self-pay

## 2020-02-18 DIAGNOSIS — Z01812 Encounter for preprocedural laboratory examination: Secondary | ICD-10-CM | POA: Diagnosis present

## 2020-02-18 DIAGNOSIS — Z20822 Contact with and (suspected) exposure to covid-19: Secondary | ICD-10-CM | POA: Diagnosis not present

## 2020-02-18 LAB — SARS CORONAVIRUS 2 (TAT 6-24 HRS): SARS Coronavirus 2: NEGATIVE

## 2020-02-18 LAB — APTT: aPTT: 30 seconds (ref 24–36)

## 2020-02-18 LAB — PROTIME-INR
INR: 1 (ref 0.8–1.2)
Prothrombin Time: 12.8 seconds (ref 11.4–15.2)

## 2020-02-20 NOTE — OR Nursing (Signed)
Attempted to contact patient regarding 3hr delay for surgery 12/21/20. Both home and mobile numbers were contacted and a voicemail was left. Will make one more attempt to contact patient.

## 2020-02-21 ENCOUNTER — Ambulatory Visit: Payer: Medicare Other | Admitting: Urgent Care

## 2020-02-21 ENCOUNTER — Encounter: Payer: Self-pay | Admitting: Orthopedic Surgery

## 2020-02-21 ENCOUNTER — Other Ambulatory Visit: Payer: Self-pay

## 2020-02-21 ENCOUNTER — Ambulatory Visit: Payer: Medicare Other

## 2020-02-21 ENCOUNTER — Ambulatory Visit
Admission: RE | Admit: 2020-02-21 | Discharge: 2020-02-21 | Disposition: A | Discharge status: Home / Self Care | Payer: Medicare Other | Attending: Orthopedic Surgery | Admitting: Orthopedic Surgery

## 2020-02-21 ENCOUNTER — Encounter
Admission: RE | Disposition: A | Discharge status: Home / Self Care | Payer: Self-pay | Source: Home / Self Care | Attending: Orthopedic Surgery

## 2020-02-21 DIAGNOSIS — J449 Chronic obstructive pulmonary disease, unspecified: Secondary | ICD-10-CM | POA: Insufficient documentation

## 2020-02-21 DIAGNOSIS — Z01818 Encounter for other preprocedural examination: Secondary | ICD-10-CM

## 2020-02-21 DIAGNOSIS — M3214 Glomerular disease in systemic lupus erythematosus: Secondary | ICD-10-CM | POA: Diagnosis not present

## 2020-02-21 DIAGNOSIS — N183 Chronic kidney disease, stage 3 unspecified: Secondary | ICD-10-CM | POA: Diagnosis not present

## 2020-02-21 DIAGNOSIS — Z79899 Other long term (current) drug therapy: Secondary | ICD-10-CM | POA: Insufficient documentation

## 2020-02-21 DIAGNOSIS — Z833 Family history of diabetes mellitus: Secondary | ICD-10-CM | POA: Insufficient documentation

## 2020-02-21 DIAGNOSIS — Z885 Allergy status to narcotic agent status: Secondary | ICD-10-CM | POA: Diagnosis not present

## 2020-02-21 DIAGNOSIS — M7581 Other shoulder lesions, right shoulder: Secondary | ICD-10-CM | POA: Insufficient documentation

## 2020-02-21 DIAGNOSIS — I129 Hypertensive chronic kidney disease with stage 1 through stage 4 chronic kidney disease, or unspecified chronic kidney disease: Secondary | ICD-10-CM | POA: Diagnosis not present

## 2020-02-21 DIAGNOSIS — M19011 Primary osteoarthritis, right shoulder: Secondary | ICD-10-CM | POA: Insufficient documentation

## 2020-02-21 DIAGNOSIS — Z8042 Family history of malignant neoplasm of prostate: Secondary | ICD-10-CM | POA: Insufficient documentation

## 2020-02-21 DIAGNOSIS — Z791 Long term (current) use of non-steroidal anti-inflammatories (NSAID): Secondary | ICD-10-CM | POA: Diagnosis not present

## 2020-02-21 DIAGNOSIS — Z825 Family history of asthma and other chronic lower respiratory diseases: Secondary | ICD-10-CM | POA: Insufficient documentation

## 2020-02-21 DIAGNOSIS — Z8673 Personal history of transient ischemic attack (TIA), and cerebral infarction without residual deficits: Secondary | ICD-10-CM | POA: Insufficient documentation

## 2020-02-21 DIAGNOSIS — Z8249 Family history of ischemic heart disease and other diseases of the circulatory system: Secondary | ICD-10-CM | POA: Diagnosis not present

## 2020-02-21 DIAGNOSIS — Z9889 Other specified postprocedural states: Secondary | ICD-10-CM

## 2020-02-21 DIAGNOSIS — Z800 Family history of malignant neoplasm of digestive organs: Secondary | ICD-10-CM | POA: Insufficient documentation

## 2020-02-21 DIAGNOSIS — Z823 Family history of stroke: Secondary | ICD-10-CM | POA: Diagnosis not present

## 2020-02-21 DIAGNOSIS — Z8371 Family history of colonic polyps: Secondary | ICD-10-CM | POA: Diagnosis not present

## 2020-02-21 DIAGNOSIS — I2510 Atherosclerotic heart disease of native coronary artery without angina pectoris: Secondary | ICD-10-CM | POA: Insufficient documentation

## 2020-02-21 HISTORY — PX: TOTAL SHOULDER ARTHROPLASTY: SHX126

## 2020-02-21 LAB — ABO/RH: ABO/RH(D): A POS

## 2020-02-21 SURGERY — ARTHROPLASTY, SHOULDER, TOTAL
Anesthesia: General | Site: Shoulder | Laterality: Right

## 2020-02-21 MED ORDER — TRANEXAMIC ACID-NACL 1000-0.7 MG/100ML-% IV SOLN: 1000.0000 mg | INTRAVENOUS | Status: AC

## 2020-02-21 MED ORDER — THROMBIN 5000 UNITS EX SOLR
CUTANEOUS | Status: DC | PRN
  Administered 2020-02-21: 5000 [IU] via TOPICAL

## 2020-02-21 MED ORDER — CHLORHEXIDINE GLUCONATE 0.12 % MT SOLN: 15.0000 mL | Freq: Once | OROMUCOSAL | Status: DC

## 2020-02-21 MED ORDER — CEFAZOLIN SODIUM-DEXTROSE 2-4 GM/100ML-% IV SOLN
INTRAVENOUS | Status: AC
  Filled 2020-02-21: qty 100

## 2020-02-21 MED ORDER — ONDANSETRON 4 MG PO TBDP: 4.0000 mg | ORAL_TABLET | Freq: Three times a day (TID) | ORAL | 0 refills | Status: AC | PRN

## 2020-02-21 MED ORDER — MIDAZOLAM HCL 2 MG/2ML IJ SOLN
INTRAMUSCULAR | Status: AC
  Filled 2020-02-21: qty 2

## 2020-02-21 MED ORDER — TRANEXAMIC ACID-NACL 1000-0.7 MG/100ML-% IV SOLN
INTRAVENOUS | Status: AC
  Filled 2020-02-21: qty 100

## 2020-02-21 MED ORDER — PROPOFOL 10 MG/ML IV BOLUS
INTRAVENOUS | Status: DC | PRN
  Administered 2020-02-21: 140 mg via INTRAVENOUS

## 2020-02-21 MED ORDER — FENTANYL CITRATE (PF) 100 MCG/2ML IJ SOLN: 50.0000 ug | Freq: Once | INTRAMUSCULAR | Status: AC

## 2020-02-21 MED ORDER — BUPIVACAINE LIPOSOME 1.3 % IJ SUSP
INTRAMUSCULAR | Status: AC
  Filled 2020-02-21: qty 20

## 2020-02-21 MED ORDER — FENTANYL CITRATE (PF) 100 MCG/2ML IJ SOLN
INTRAMUSCULAR | Status: AC
  Administered 2020-02-21: 50 ug via INTRAVENOUS
  Filled 2020-02-21: qty 2

## 2020-02-21 MED ORDER — LACTATED RINGERS IV SOLN: INTRAVENOUS | Status: DC

## 2020-02-21 MED ORDER — BUPIVACAINE-EPINEPHRINE (PF) 0.25% -1:200000 IJ SOLN
INTRAMUSCULAR | Status: AC
  Filled 2020-02-21: qty 30

## 2020-02-21 MED ORDER — LIDOCAINE HCL (PF) 1 % IJ SOLN
INTRAMUSCULAR | Status: AC
  Filled 2020-02-21: qty 5

## 2020-02-21 MED ORDER — FENTANYL CITRATE (PF) 100 MCG/2ML IJ SOLN
INTRAMUSCULAR | Status: DC | PRN
  Administered 2020-02-21 (×2): 50 ug via INTRAVENOUS

## 2020-02-21 MED ORDER — PROPOFOL 10 MG/ML IV BOLUS
INTRAVENOUS | Status: AC
  Filled 2020-02-21: qty 20

## 2020-02-21 MED ORDER — BUPIVACAINE HCL (PF) 0.5 % IJ SOLN
INTRAMUSCULAR | Status: AC
  Filled 2020-02-21: qty 10

## 2020-02-21 MED ORDER — DEXAMETHASONE SODIUM PHOSPHATE 10 MG/ML IJ SOLN
INTRAMUSCULAR | Status: DC | PRN
  Administered 2020-02-21: 10 mg via INTRAVENOUS

## 2020-02-21 MED ORDER — PHENYLEPHRINE HCL (PRESSORS) 10 MG/ML IV SOLN
INTRAVENOUS | Status: AC
  Filled 2020-02-21: qty 1

## 2020-02-21 MED ORDER — VANCOMYCIN HCL 1000 MG IV SOLR
INTRAVENOUS | Status: AC
  Filled 2020-02-21: qty 1000

## 2020-02-21 MED ORDER — TRANEXAMIC ACID-NACL 1000-0.7 MG/100ML-% IV SOLN
INTRAVENOUS | Status: AC
  Administered 2020-02-21: 1000 mg via INTRAVENOUS
  Filled 2020-02-21: qty 100

## 2020-02-21 MED ORDER — ROCURONIUM BROMIDE 100 MG/10ML IV SOLN
INTRAVENOUS | Status: DC | PRN
  Administered 2020-02-21: 20 mg via INTRAVENOUS
  Administered 2020-02-21: 50 mg via INTRAVENOUS
  Administered 2020-02-21 (×2): 10 mg via INTRAVENOUS

## 2020-02-21 MED ORDER — TRANEXAMIC ACID 1000 MG/10ML IV SOLN
INTRAVENOUS | Status: DC | PRN
  Administered 2020-02-21: 1000 mg via INTRAVENOUS

## 2020-02-21 MED ORDER — CEFAZOLIN SODIUM-DEXTROSE 2-4 GM/100ML-% IV SOLN
2.0000 g | INTRAVENOUS | Status: AC
  Administered 2020-02-21: 2 g via INTRAVENOUS

## 2020-02-21 MED ORDER — ACETAMINOPHEN 500 MG PO TABS: 1000.0000 mg | ORAL_TABLET | Freq: Three times a day (TID) | ORAL | 2 refills | Status: AC

## 2020-02-21 MED ORDER — PHENYLEPHRINE HCL (PRESSORS) 10 MG/ML IV SOLN
INTRAVENOUS | Status: DC | PRN
  Administered 2020-02-21 (×2): 100 ug via INTRAVENOUS

## 2020-02-21 MED ORDER — NEOMYCIN-POLYMYXIN B GU 40-200000 IR SOLN
Status: AC
  Filled 2020-02-21: qty 4

## 2020-02-21 MED ORDER — NEOMYCIN-POLYMYXIN B GU 40-200000 IR SOLN
Status: DC | PRN
  Administered 2020-02-21: 4 mL
  Administered 2020-02-21: 12 mL

## 2020-02-21 MED ORDER — CHLORHEXIDINE GLUCONATE 0.12 % MT SOLN
OROMUCOSAL | Status: AC
  Filled 2020-02-21: qty 15

## 2020-02-21 MED ORDER — SUGAMMADEX SODIUM 200 MG/2ML IV SOLN
INTRAVENOUS | Status: DC | PRN
  Administered 2020-02-21: 200 mg via INTRAVENOUS

## 2020-02-21 MED ORDER — CEFAZOLIN SODIUM-DEXTROSE 2-4 GM/100ML-% IV SOLN: 2.0000 g | Freq: Once | INTRAVENOUS | Status: AC

## 2020-02-21 MED ORDER — MIDAZOLAM HCL 2 MG/2ML IJ SOLN
INTRAMUSCULAR | Status: AC
  Administered 2020-02-21: 1 mg via INTRAVENOUS
  Filled 2020-02-21: qty 2

## 2020-02-21 MED ORDER — OXYCODONE HCL 5 MG PO TABS: 5.0000 mg | ORAL_TABLET | ORAL | 0 refills | Status: AC | PRN

## 2020-02-21 MED ORDER — FENTANYL CITRATE (PF) 100 MCG/2ML IJ SOLN
INTRAMUSCULAR | Status: AC
  Filled 2020-02-21: qty 2

## 2020-02-21 MED ORDER — MIDAZOLAM HCL 2 MG/2ML IJ SOLN: 1.0000 mg | Freq: Once | INTRAMUSCULAR | Status: AC

## 2020-02-21 MED ORDER — BUPIVACAINE HCL (PF) 0.5 % IJ SOLN
INTRAMUSCULAR | Status: DC | PRN
  Administered 2020-02-21: 10 mL via PERINEURAL

## 2020-02-21 MED ORDER — SUCCINYLCHOLINE CHLORIDE 20 MG/ML IJ SOLN
INTRAMUSCULAR | Status: DC | PRN
  Administered 2020-02-21: 120 mg via INTRAVENOUS

## 2020-02-21 MED ORDER — ASPIRIN EC 325 MG PO TBEC: 325.0000 mg | DELAYED_RELEASE_TABLET | Freq: Every day | ORAL | 0 refills | Status: AC

## 2020-02-21 MED ORDER — VANCOMYCIN HCL 1000 MG IV SOLR
INTRAVENOUS | Status: DC | PRN
  Administered 2020-02-21: 1000 mg via TOPICAL

## 2020-02-21 MED ORDER — ORAL CARE MOUTH RINSE: 15.0000 mL | Freq: Once | OROMUCOSAL | Status: DC

## 2020-02-21 MED ORDER — BUPIVACAINE LIPOSOME 1.3 % IJ SUSP
INTRAMUSCULAR | Status: DC | PRN
  Administered 2020-02-21: 20 mL via PERINEURAL

## 2020-02-21 MED ORDER — GLYCOPYRROLATE 0.2 MG/ML IJ SOLN
INTRAMUSCULAR | Status: DC | PRN
  Administered 2020-02-21 (×2): .2 mg via INTRAVENOUS

## 2020-02-21 MED ORDER — CEFAZOLIN SODIUM-DEXTROSE 2-4 GM/100ML-% IV SOLN
INTRAVENOUS | Status: AC
  Administered 2020-02-21: 2 g via INTRAVENOUS
  Filled 2020-02-21: qty 100

## 2020-02-21 MED ORDER — LIDOCAINE HCL (PF) 1 % IJ SOLN
INTRAMUSCULAR | Status: DC | PRN
  Administered 2020-02-21: 5 mL via SUBCUTANEOUS

## 2020-02-21 MED ORDER — LIDOCAINE HCL (CARDIAC) PF 100 MG/5ML IV SOSY
PREFILLED_SYRINGE | INTRAVENOUS | Status: DC | PRN
  Administered 2020-02-21: 100 mg via INTRAVENOUS

## 2020-02-21 MED ORDER — THROMBIN 5000 UNITS EX SOLR
CUTANEOUS | Status: AC
  Filled 2020-02-21: qty 5000

## 2020-02-21 MED ORDER — ONDANSETRON HCL 4 MG/2ML IJ SOLN
INTRAMUSCULAR | Status: DC | PRN
  Administered 2020-02-21 (×2): 4 mg via INTRAVENOUS

## 2020-02-21 MED ORDER — SODIUM CHLORIDE 0.9 % IR SOLN
Status: DC | PRN
  Administered 2020-02-21: 3000 mL

## 2020-02-21 MED ORDER — SODIUM CHLORIDE 0.9 % IV SOLN
INTRAVENOUS | Status: DC | PRN
  Administered 2020-02-21: 50 ug/min via INTRAVENOUS

## 2020-02-21 SURGICAL SUPPLY — 88 items
APL PRP STRL LF DISP 70% ISPRP (MISCELLANEOUS) ×1
BLADE SAGITTAL WIDE XTHICK NO (BLADE) ×2 IMPLANT
BLADE SURG 15 STRL LF DISP TIS (BLADE) ×1 IMPLANT
BLADE SURG 15 STRL SS (BLADE) ×2
BOWL CEMENT MIX W/ADAPTER (MISCELLANEOUS) ×4 IMPLANT
CANISTER SUCT 1200ML W/VALVE (MISCELLANEOUS) ×2 IMPLANT
CEMENT BONE 40GM (Cement) ×2 IMPLANT
CHLORAPREP W/TINT 26 (MISCELLANEOUS) ×2 IMPLANT
CNTNR SPEC 2.5X3XGRAD LEK (MISCELLANEOUS) ×1
CONT SPEC 4OZ STER OR WHT (MISCELLANEOUS) ×1
CONT SPEC 4OZ STRL OR WHT (MISCELLANEOUS) ×1
CONTAINER SPEC 2.5X3XGRAD LEK (MISCELLANEOUS) ×1 IMPLANT
COOLER POLAR GLACIER W/PUMP (MISCELLANEOUS) ×2 IMPLANT
COVER BACK TABLE REUSABLE LG (DRAPES) ×2 IMPLANT
COVER WAND RF STERILE (DRAPES) ×2 IMPLANT
DRAPE 3/4 80X56 (DRAPES) ×4 IMPLANT
DRAPE IMP U-DRAPE 54X76 (DRAPES) ×4 IMPLANT
DRAPE INCISE IOBAN 66X45 STRL (DRAPES) ×4 IMPLANT
DRAPE U-SHAPE 47X51 STRL (DRAPES) ×2 IMPLANT
DRSG OPSITE POSTOP 4X8 (GAUZE/BANDAGES/DRESSINGS) ×2 IMPLANT
DRSG TEGADERM 2-3/8X2-3/4 SM (GAUZE/BANDAGES/DRESSINGS) ×2 IMPLANT
ELECT REM PT RETURN 9FT ADLT (ELECTROSURGICAL) ×2
ELECTRODE REM PT RTRN 9FT ADLT (ELECTROSURGICAL) ×1 IMPLANT
GAUZE XEROFORM 1X8 LF (GAUZE/BANDAGES/DRESSINGS) ×2 IMPLANT
GLENOID PEG SHOULDER 40MM SML (Shoulder) ×1 IMPLANT
GLOVE BIO SURGEON STRL SZ7.5 (GLOVE) ×2 IMPLANT
GLOVE SRG 8 PF TXTR STRL LF DI (GLOVE) ×2 IMPLANT
GLOVE SURG ORTHO LTX SZ8 (GLOVE) ×2 IMPLANT
GLOVE SURG UNDER POLY LF SZ8 (GLOVE) ×4
GOWN STRL REUS W/ TWL LRG LVL3 (GOWN DISPOSABLE) ×2 IMPLANT
GOWN STRL REUS W/ TWL XL LVL3 (GOWN DISPOSABLE) ×1 IMPLANT
GOWN STRL REUS W/TWL LRG LVL3 (GOWN DISPOSABLE) ×4
GOWN STRL REUS W/TWL XL LVL3 (GOWN DISPOSABLE) ×2
GUIDE GLENOID PATIENT (MISCELLANEOUS) ×2 IMPLANT
GUIDEWIRE GLENOID 2.5X220 (WIRE) ×2 IMPLANT
HEAD HUM 1.5XLO OFST 15X41 (Stem) ×1 IMPLANT
HEAD HUM AEQUALIS 41X15 (Stem) ×2 IMPLANT
HEMOSTAT SURGICEL 2X14 (HEMOSTASIS) IMPLANT
HEMOSTAT SURGICEL 2X3 (HEMOSTASIS) ×4 IMPLANT
HEMOVAC 400CC 10FR (MISCELLANEOUS) IMPLANT
HOLDER FOLEY CATH W/STRAP (MISCELLANEOUS) ×2 IMPLANT
HOOD PEEL AWAY FLYTE STAYCOOL (MISCELLANEOUS) ×8 IMPLANT
HUMERAL STEM AEQUALIS 3X74 (Shoulder) ×2 IMPLANT
KIT STABILIZATION SHOULDER (MISCELLANEOUS) ×2 IMPLANT
KIT TURNOVER KIT A (KITS) ×2 IMPLANT
MANIFOLD NEPTUNE II (INSTRUMENTS) ×2 IMPLANT
MASK FACE SPIDER DISP (MASK) ×2 IMPLANT
MAT ABSORB  FLUID 56X50 GRAY (MISCELLANEOUS) ×1
MAT ABSORB FLUID 56X50 GRAY (MISCELLANEOUS) ×1 IMPLANT
NDL SAFETY ECLIPSE 18X1.5 (NEEDLE) ×1 IMPLANT
NEEDLE HYPO 18GX1.5 SHARP (NEEDLE) ×2
NEEDLE REVERSE CUT 1/2 CRC (NEEDLE) ×2 IMPLANT
NEEDLE SPNL 20GX3.5 QUINCKE YW (NEEDLE) ×2 IMPLANT
NS IRRIG 1000ML POUR BTL (IV SOLUTION) ×2 IMPLANT
PACK ARTHROSCOPY SHOULDER (MISCELLANEOUS) ×2 IMPLANT
PAD ARMBOARD 7.5X6 YLW CONV (MISCELLANEOUS) ×4 IMPLANT
PAD WRAPON POLAR SHDR UNIV (MISCELLANEOUS) ×1 IMPLANT
PULSAVAC PLUS IRRIG FAN TIP (DISPOSABLE) ×2
RETRIEVER SUT HEWSON (MISCELLANEOUS) IMPLANT
SHOULDER GLENOID PEG 40MM SML (Shoulder) ×2 IMPLANT
SLING ULTRA II LG (MISCELLANEOUS) IMPLANT
SLING ULTRA II M (MISCELLANEOUS) IMPLANT
SOL .9 NS 3000ML IRR  AL (IV SOLUTION) ×1
SOL .9 NS 3000ML IRR AL (IV SOLUTION) ×1
SOL .9 NS 3000ML IRR UROMATIC (IV SOLUTION) ×1 IMPLANT
SPONGE GAUZE 2X2 8PLY STRL LF (GAUZE/BANDAGES/DRESSINGS) ×2 IMPLANT
SPONGE LAP 18X18 RF (DISPOSABLE) ×6 IMPLANT
STAPLER SKIN PROX 35W (STAPLE) IMPLANT
STEM HUMERAL AEQUALIS 3X74 (Shoulder) ×1 IMPLANT
STRAP SAFETY 5IN WIDE (MISCELLANEOUS) ×4 IMPLANT
SUT FIBERWIRE #2 38 BLUE 1/2 (SUTURE) ×8
SUT MNCRL AB 4-0 PS2 18 (SUTURE) IMPLANT
SUT TICRON 2-0 30IN 311381 (SUTURE) ×6 IMPLANT
SUT VIC AB 0 CT1 27 (SUTURE) ×2
SUT VIC AB 0 CT1 27XCR 8 STRN (SUTURE) ×1 IMPLANT
SUT VIC AB 0 CT1 36 (SUTURE) ×2 IMPLANT
SUT VIC AB 2-0 CT2 27 (SUTURE) ×6 IMPLANT
SUTURE FIBERWR #2 38 BLUE 1/2 (SUTURE) ×4 IMPLANT
SYR 20ML LL LF (SYRINGE) ×2 IMPLANT
SYR 30ML LL (SYRINGE) ×4 IMPLANT
SYR 5ML LL (SYRINGE) ×2 IMPLANT
SYR TOOMEY IRRIG 70ML (MISCELLANEOUS) ×4
SYRINGE TOOMEY IRRIG 70ML (MISCELLANEOUS) ×2 IMPLANT
TAPE STRIPS DRAPE STRL (GAUZE/BANDAGES/DRESSINGS) ×2 IMPLANT
TAPE TRANSPORE STRL 2 31045 (GAUZE/BANDAGES/DRESSINGS) ×2 IMPLANT
TIP FAN IRRIG PULSAVAC PLUS (DISPOSABLE) ×1 IMPLANT
TRAY FOLEY SLVR 16FR LF STAT (SET/KITS/TRAYS/PACK) ×2 IMPLANT
WRAPON POLAR PAD SHDR UNIV (MISCELLANEOUS) ×2

## 2020-02-21 NOTE — Anesthesia Procedure Notes (Signed)
Anesthesia Regional Block: Interscalene brachial plexus block   Pre-Anesthetic Checklist: ,, timeout performed, Correct Patient, Correct Site, Correct Laterality, Correct Procedure, Correct Position, site marked, Risks and benefits discussed,  Surgical consent,  Pre-op evaluation,  At surgeon's request and post-op pain management  Laterality: Right and Upper  Prep: chloraprep       Needles:  Injection technique: Single-shot  Needle Type: Stimiplex     Needle Length: 5cm  Needle Gauge: 22     Additional Needles:   Procedures:,,,, ultrasound used (permanent image in chart),,,,  Narrative:  Start time: 02/21/2020 10:21 AM End time: 02/21/2020 10:25 AM Injection made incrementally with aspirations every 5 mL.  Performed by: Personally  Anesthesiologist: Martha Clan, MD  Additional Notes: Functioning IV was confirmed and monitors were applied.  A 8mm 22ga Stimuplex needle was used. Sterile prep and drape,hand hygiene and sterile gloves were used.  Negative aspiration and negative test dose prior to incremental administration of local anesthetic. The patient tolerated the procedure well.

## 2020-02-21 NOTE — Transfer of Care (Signed)
Immediate Anesthesia Transfer of Care Note  Patient: Bailey Dominguez  Procedure(s) Performed: RIGHT TOTAL SHOULDER ARTHROPLASTY, BICEPS TENODESIS TODD MUNDY TO ASSIST (Right Shoulder)  Patient Location: PACU  Anesthesia Type:General  Level of Consciousness: awake, alert  and oriented  Airway & Oxygen Therapy: Patient Spontanous Breathing and Patient connected to face mask oxygen  Post-op Assessment: Report given to RN and Post -op Vital signs reviewed and stable  Post vital signs: Reviewed and stable  Last Vitals:  Vitals Value Taken Time  BP    Temp    Pulse 75 02/21/20 1514  Resp 31 02/21/20 1515  SpO2 100 % 02/21/20 1514  Vitals shown include unvalidated device data.  Last Pain:  Vitals:   02/21/20 0822  TempSrc: Oral  PainSc: 7          Complications: No complications documented.

## 2020-02-21 NOTE — Discharge Instructions (Addendum)
Bailey Mulligan, MD  [facility]  Phone: [phone]  Fax: [phone]   Discharge Instructions after Total Shoulder Replacement   1. Activity/Sling: You are to be non-weight bearing on operative extremity. A sling/shoulder immobilizer has been provided for you. Only remove the sling to perform the motion exercises (attached) and hygiene/dressing. Active reaching and lifting are not permitted. You will be given further instructions on sling use at your first physical therapy visit and postoperative visit with Dr. Posey Pronto.   2. Dressings: Dressing may be removed at 1st physical therapy visit (~3-4 days after surgery). Afterwards, you may either leave open to air (if no drainage) or cover with dry, sterile dressing. If you have steri-strips on your wound, please do not remove them. They will fall off on their own. You may shower 5 days after surgery. Please pat incision dry. Do not rub or place any shear forces across incision. If there is drainage or any opening of incision after 5 days, please notify our offices immediately.   3. Driving:  Plan on not driving for six weeks. Please note that you are advised NOT to drive while taking narcotic pain medications as you may be impaired and unsafe to drive.  4. Medications:  - You have been provided a prescription for narcotic pain medicine (usually oxycodone). After surgery, take 1-2 narcotic tablets every 4 hours if needed for severe pain. Please start this as soon as you begin to start having pain (if you received a nerve block, start taking as soon as this wears off).  - A prescription for anti-nausea medication will be provided in case the narcotic medicine causes nausea - take 1 tablet every 6 hours only if nauseated.  - Take enteric coated aspirin 325 mg once daily for 6 weeks to prevent blood clots. Do not take aspirin if you have an aspirin sensitivity/allergy or asthma or are on an anticoagulant (blood thinner) already. If so, then your  home anticoagulant will be resume and managed - do not take aspirin. -Take tylenol 1000mg  (2 Extra strength or 3 regular strength tablets) every 8 hours for pain. This will reduce the amount of narcotic medication needed. May stop tylenol when you are having minimal pain. - Take a stool softener (Colace, Dulcolax or Senakot) if you are using narcotic pain medications to help with constipation that is associated with narcotic use. - DO NOT take ANY nonsteroidal anti-inflammatory pain medications: Advil, Motrin, Ibuprofen, Aleve, Naproxen, or Naprosyn. - Restart home Plavix the day after surgery.  If you are taking prescription medication for anxiety, depression, insomnia, muscle spasm, chronic pain, or for attention deficit disorder you are advised that you are at a higher risk of adverse effects with use of narcotics post-op, including narcotic addiction/dependence, depressed breathing, death. If you use non-prescribed substances: alcohol, marijuana, cocaine, heroin, methamphetamines, etc., you are at a higher risk of adverse effects with use of narcotics post-op, including narcotic addiction/dependence, depressed breathing, death. You are advised that taking > 50 morphine milligram equivalents (MME) of narcotic pain medication per day results in twice the risk of overdose or death. For your prescription provided: oxycodone 5 mg - taking more than 6 tablets per day after the first few days of surgery.  5. Physical Therapy: 1-2 times per week for ~12 weeks. Therapy typically starts on post operative Day 3 or 4. You have been provided an order for physical therapy. The therapist will provide home exercises. Please contact our offices if this appointment has not been scheduled.  6. Work: May do light duty/desk job in approximately 2 weeks when off of narcotics, pain is well-controlled, and swelling has decreased if able to function with one arm in sling. Full work may take 6 weeks if light motions and  function of both arms is required. Lifting jobs may require 12 weeks.  7. Post-Op Appointments: Your first post-op appointment will be with Dr. Posey Pronto in approximately 2 weeks time.   If you find that they have not been scheduled please call the Orthopaedic Appointment front desk at [phone].    Bupivacaine Liposomal Suspension for Injection   WEAR GREEN BRACELET FOR THREE DAYS What is this medicine? BUPIVACAINE LIPOSOMAL (bue PIV a kane LIP oh som al) is an anesthetic. It causes loss of feeling in the skin or other tissues. It is used to prevent and to treat pain from some procedures. This medicine may be used for other purposes; ask your health care provider or pharmacist if you have questions. COMMON BRAND NAME(S): EXPAREL What should I tell my health care provider before I take this medicine? They need to know if you have any of these conditions:  G6PD deficiency  heart disease  kidney disease  liver disease  low blood pressure  lung or breathing disease, like asthma  an unusual or allergic reaction to bupivacaine, other medicines, foods, dyes, or preservatives  pregnant or trying to get pregnant  breast-feeding How should I use this medicine? This medicine is injected into the affected area. It is given by a health care provider in a hospital or clinic setting. Talk to your health care provider about the use of this medicine in children. While it may be given to children as young as 6 years for selected conditions, precautions do apply. Overdosage: If you think you have taken too much of this medicine contact a poison control center or emergency room at once. NOTE: This medicine is only for you. Do not share this medicine with others. What if I miss a dose? This does not apply. What may interact with this medicine? This medicine may interact with the following medications:  acetaminophen  certain antibiotics like dapsone, nitrofurantoin, aminosalicylic acid,  sulfonamides  certain medicines for seizures like phenobarbital, phenytoin, valproic acid  chloroquine  cyclophosphamide  flutamide  hydroxyurea  ifosfamide  metoclopramide  nitric oxide  nitroglycerin  nitroprusside  nitrous oxide  other local anesthetics like lidocaine, pramoxine, tetracaine  primaquine  quinine  rasburicase  sulfasalazine This list may not describe all possible interactions. Give your health care provider a list of all the medicines, herbs, non-prescription drugs, or dietary supplements you use. Also tell them if you smoke, drink alcohol, or use illegal drugs. Some items may interact with your medicine. What should I watch for while using this medicine? Your condition will be monitored carefully while you are receiving this medicine. Be careful to avoid injury while the area is numb, and you are not aware of pain. What side effects may I notice from receiving this medicine? Side effects that you should report to your doctor or health care professional as soon as possible:  allergic reactions like skin rash, itching or hives, swelling of the face, lips, or tongue  seizures  signs and symptoms of a dangerous change in heartbeat or heart rhythm like chest pain; dizziness; fast, irregular heartbeat; palpitations; feeling faint or lightheaded; falls; breathing problems  signs and symptoms of methemoglobinemia such as pale, gray, or blue colored skin; headache; fast heartbeat; shortness of breath; feeling faint  or lightheaded, falls; tiredness Side effects that usually do not require medical attention (report to your doctor or health care professional if they continue or are bothersome):  anxious  back pain  changes in taste  changes in vision  constipation  dizziness  fever  nausea, vomiting This list may not describe all possible side effects. Call your doctor for medical advice about side effects. You may report side effects to FDA at  1-800-FDA-1088. Where should I keep my medicine? This drug is given in a hospital or clinic and will not be stored at home. NOTE: This sheet is a summary. It may not cover all possible information. If you have questions about this medicine, talk to your doctor, pharmacist, or health care provider.  2021 Elsevier/Gold Standard (2019-04-29 12:24:57)                    Home Exercises . Perform passive, assisted forward flexion and external rotation (outward turning) exercises with the operative arm. You were taught these exercises prior to discharge. Both exercises should be done with the non-operative arm used as the "therapist arm" while the operative arm remains completely relaxed.  . 10 of each exercise should be done 5 times daily, work up to the max degrees  Forward Flexion Maximum: 140 deg. Lie flat on your back, completely relax your operative arm like a wet noodle, and grasp the wrist of the operative shoulder with your opposite hand. Using the power in your opposite arm, bring the stiff arm up only to the maximum indicated above (90 degrees indicates your arm pointed straight ahead). Start holding it for ten seconds and then work up to where you can hold it for a count of 30. Breathe slowly and deeply while the arm is moved. Repeat this stretch ten times.      External rotation Maximum: 40 deg. External rotation is turning the arm out to the side while your elbow stays close to your body. It is best stretched while you are lying on your back. Hold a cane, yardstick, broom handle, or golf club in both hands. Bend both elbows to a right angle. With your operative arm completely relaxed, use steady, gentle force from your normal arm to rotate the hand of the stiff shoulder out away from your body. Continue the rotation only to the maximum indicated above (90 degrees indicates your arm pointed straight ahead). Holding it there for a count of 10. Repeat this exercise ten  times.   Bailey Mulligan, MD  [facility]  Phone: [phone]  Fax: [phone]   Total Shoulder Arthroplasty Rehabilitation Framework  The following is a basic framework from which to work during rehabilitation following a Shoulder Arthroplasty. However, it is critical to communicate with the surgeon in order to be aware of the condition of the tissue at the time of repair, any concomitant procedures that might have been performed, etc, that might impact the progression that is appropriate for each specific patient.  1-2x/week for 10-12 weeks.   Subscapularis Safe Zones established intraoperatively by the surgeon . These ranges can start on Post-op day 1, but may require a few weeks to achieve depending on patient comfort . Supine, passive, well-arm assisted: . 140/40 Program: Max. forward flexion to 140 degrees ; Max. External rotation to 40 degrees . No abduction  ----------------------------------------------------------------------------------------------------------------------  Phase I: Passive Motion - 0-6 weeks post-op  Goals: . PROM - 140 degrees of flexion, ER of 40 by the end of week  6 (see above) . Decrease pain, Decrease muscle atrophy, Educate regarding joint protection . Provide the patient with instructions for home exercises (last pages) 5 x per day  Precautions: . Stay within safe zone determined at surgery (see above) . Week 1-2: Sling with abduction pillow at all times, removed only for 5x/day exercises, showering, and dressing . Week 3-6: Sling while out of home/uncontrolled environment, continue wearing during sleep if patient is an active sleeper. . Week 3-6: Ok to perform waist level activities WITH ELBOW AT SIDE in front of the body o Typing, eating utensils, combing hair and washing face with elbow at side o No lifting, reaching or pulling heavier than coffee cup with elbow at side  Teaching: . Emphasize home, supine, passive well-arm assisted  PROM (FF and ER as above) . Instruct in regular icing techniques or cold therapy device (use as much as possible out of 24 hours for 8-10 days) . Ice packs for 20 - 30 minutes intervals, especially at the end of an exercise session . Monitor for edema in forearm, hand, or finger  Exercises: . Pendulum exercises o With the arm hanging, the patient gently swings the hand forward and backward, then side-to-side, and then clockwise and counterclockwise . Passive, supine well-arm assisted forward flexion, in front of the plane of the scapula as pain allows per safe zone above (140/40) . Active scapular retraction, elevation in sitting or standing . Active elbow, wrist, hand ROM - Grasping and gripping lightweight objects  ----------------------------------------------------------------------------------------------------------------------  Phase II: Active Range of Motion (6-10 weeks post-op)  Goals: . Full range of motion by end of week 10. After 6 week physician visit, patient and therapist can move beyond the safe zones as pain allows. Please notify physician if range of motion is not improving by week 10.  . Emphasis should be on range of motion before strengthening. . Improve strength, Decrease pain, Increase functional activities, Scapular stabilization Precautions: . No sling use . No resisted internal rotation until 10 weeks post-op Teaching: . Encourage continued stretching at home. Limited only by pain . Ice after exercise. Exercises: . Encourage patient to use smooth, natural movement patterns . Continue to work on Passive ROM as in Phase I . Begin AROM and AAROM (using a cane), progressively, to full range of motion . Assisted forward flexion supine using uninvolved arm to assist - progressing to active motion in a reclined position and then to sitting . Side lying ER against gravity . Encourage normal scapular mechanics with active motion . Add Theraband exercises or  light dumbbell weights (2lbs) for flexion, extension, external rotation . Scapulothoracic strengthening (prone extension, prone T, etc.) . Aquatic therapy, if available, can begin no earlier than 1 month post op if wound is completely healed.  o Week 1-6: Stay within established safe zone listed above. Passive motion only o Week 6 +: Shoulder fully submerged - slow, active motions for flexion, elevation, ER/IR and horizontal abduction/adduction out to scapular plane, range of motion limited by pain Only.  ----------------------------------------------------------------------------------------------------------------------  Phase III: Final Strengthening - 10+ weeks  Goals: . If acceptable motion has been achieved (>150 FF, >50 ER, IR T12 or above), then Maximize strength--otherwise continue with stretching program . Improve neuromuscular control . Increase functional activities Precautions: . No sudden, forceful resisted IR (e.g. golfing, wood splitting, swimming) until >3 months post-op Teaching: . Continue home stretching minimum 1x per day to maintain full range of motion Exercises: . Continue to increase difficulty of theraband and dumbbell  exercises as tolerated . Increase resistance exercises - must be light enough weight that >20 reps are achieved per set . Continue aerobic training as tolerated, and modalities as appropriate . Continue to progress home program   NOTES: 1. With proper exercise, motion, strength, and function continue to improve even after one year. 2. The complication rate after surgery is 5 - 8%. Complications include infection, fracture, heterotopic bone formation, nerve injury, instability, rotator cuff tear, and tuberosity nonunion. Please look for clinical signs, unusual symptoms, or lack of progress with therapy and report those to Dr. Posey Pronto. Prefer more communication than less.  3. The therapy plan above only serves as a guide. Please be aware of  specific individualized patient instructions as written on the prescription or through discussions with the surgeon. 4. Please call Dr. Posey Pronto if you have any specific questions or concerns [phone]. 5. The patient's "Home exercise stretching program" (critical for first 10 weeks) is attached.  AMBULATORY SURGERY  DISCHARGE INSTRUCTIONS   1) The drugs that you were given will stay in your system until tomorrow so for the next 24 hours you should not:  A) Drive an automobile B) Make any legal decisions C) Drink any alcoholic beverage   2) You may resume regular meals tomorrow.  Today it is better to start with liquids and gradually work up to solid foods.  You may eat anything you prefer, but it is better to start with liquids, then soup and crackers, and gradually work up to solid foods.   3) Please notify your doctor immediately if you have any unusual bleeding, trouble breathing, redness and pain at the surgery site, drainage, fever, or pain not relieved by medication.    4) Additional Instructions:        Please contact your physician with any problems or Same Day Surgery at [phone], Monday through Friday 6 am to 4 pm, or [place] at [facility] number at [phone].        Interscalene Nerve Block with Exparel  1.  For your surgery you have received an Interscalene Nerve Block with Exparel. 2. Nerve Blocks affect many types of nerves, including nerves that control movement, pain and normal sensation.  You may experience feelings such as numbness, tingling, heaviness, weakness or the inability to move your arm or the feeling or sensation that your arm has "fallen asleep". 3. A nerve block with Exparel can last up to 5 days.  Usually the weakness wears off first.  The tingling and heaviness usually wear off next.  Finally you may start to notice pain.  Keep in mind that this may occur in any order.  Once a nerve block starts to wear off it is usually  completely gone within 60 minutes. 4. ISNB may cause mild shortness of breath, a hoarse voice, blurry vision, unequal pupils, or drooping of the face on the same side as the nerve block.  These symptoms will usually resolve with the numbness.  Very rarely the procedure itself can cause mild seizures. 5. If needed, your surgeon will give you a prescription for pain medication.  It will take about 60 minutes for the oral pain medication to become fully effective.  So, it is recommended that you start taking this medication before the nerve block first begins to wear off, or when you first begin to feel discomfort. 6. Take your pain medication only as prescribed.  Pain medication can cause sedation and decrease your breathing if you take more than you  need for the level of pain that you have. 7. Nausea is a common side effect of many pain medications.  You may want to eat something before taking your pain medicine to prevent nausea. 8. After an Interscalene nerve block, you cannot feel pain, pressure or extremes in temperature in the effected arm.  Because your arm is numb it is at an increased risk for injury.  To decrease the possibility of injury, please practice the following:  a. While you are awake change the position of your arm frequently to prevent too much pressure on any one area for prolonged periods of time. b.  If you have a cast or tight dressing, check the color or your fingers every couple of hours.  Call your surgeon with the appearance of any discoloration (white or blue). c. If you are given a sling to wear before you go home, please wear it  at all times until the block has completely worn off.  Do not get up at night without your sling. d. Please contact [facility] or your surgeon if you do not begin to regain sensation after 7 days from the surgery.  Anesthesia may be contacted by calling the Same Day Surgery Department, Mon. through Fri., 6 am to 4 pm at [phone].   e. If you  experience any other problems or concerns, please contact your surgeon's office. f. If you experience severe or prolonged shortness of breath go to the nearest emergency department.  AMBULATORY SURGERY  DISCHARGE INSTRUCTIONS   5) The drugs that you were given will stay in your system until tomorrow so for the next 24 hours you should not:  D) Drive an automobile E) Make any legal decisions F) Drink any alcoholic beverage   6) You may resume regular meals tomorrow.  Today it is better to start with liquids and gradually work up to solid foods.  You may eat anything you prefer, but it is better to start with liquids, then soup and crackers, and gradually work up to solid foods.   7) Please notify your doctor immediately if you have any unusual bleeding, trouble breathing, redness and pain at the surgery site, drainage, fever, or pain not relieved by medication.    8) Additional Instructions:        Please contact your physician with any problems or Same Day Surgery at [phone], Monday through Friday 6 am to 4 pm, or [place] at [facility] number at [phone].

## 2020-02-21 NOTE — Anesthesia Procedure Notes (Signed)

## 2020-02-21 NOTE — Op Note (Addendum)
Operative Note    SURGERY DATE: 02/21/2020   PRE-OP DIAGNOSIS:  1. Right shoulder osteoarthritis   POST-OP DIAGNOSIS:  1. Right shoulder osteoarthritis 2. Right biceps tendinopathy   PROCEDURES:  1. Right total shoulder arthroplasty 2. Right biceps tenodesis   SURGEON: Cato Mulligan, MD  ASSISTANTS: Reche Dixon, PA   ANESTHESIA: Gen + interscalene block   ESTIMATED BLOOD LOSS: 300cc   TOTAL IV FLUIDS: per anesthesia  IMPLANTS: Tornier Aequalis:  - Perform Cortiloc Pegged Glenoid s40  - Ascend Flex Humeral Stem - Size 3C - Humeral Head 41 x 53mm Low Ecc head   INDICATION(S):  Bailey Dominguez is a 65 y.o. female with chronic shoulder pain. There is clinical and radiographic evidence for the diagnosis of shoulder arthritis.  She has had prior rotator cuff repair approximately 10 years ago and recovered well.  Preoperative MRI showed no significant full-thickness rotator cuff tear.  Conservative measures including medications and cortisone injections have not provided adequate relief. After discussion of risks, benefits, and alternatives to surgery, the patient elected to proceed.    OPERATIVE FINDINGS: end-stage glenohumeral arthritis, biceps tendinopathy     OPERATIVE REPORT:   I identified Bailey Dominguez in the pre-operative holding area. Informed consent was obtained and the surgical site was marked. I reviewed the risks and benefits of the proposed surgical intervention and the patient (and/or patient's guardian) wished to proceed. An interscalene block with Exparel was administered by the Anesthesia team. The patient was transferred to the operative suite and general anesthesia was administered. The patient was placed in the beach chair position with the head of the bed elevated approximately 45 degrees. All down side pressure points were appropriately padded. Pre-op exam under anesthesia confirmed some stiffness and crepitus. Appropriate IV antibiotics were administered within 30  minutes before incision. Tranexamic acid was also administered after verifying that the patient had no contraindications. The extremity was then prepped and draped in standard fashion. A time out was performed confirming the correct extremity, correct patient, and correct procedure.   We used the standard deltopectoral incision from the coracoid to ~12cm distal. We found the cephalic vein and took it laterally, and it was protected throughout the case. We opened the deltopectoral interval widely and placed retractors under the CA ligament in the subacromial space and under the deltoid tendon at its insertion. We then abducted and internally rotated the arm and released the underlying bursa between these retractors, taking care not to damage the circumflex branch of the axillary nerve.   Next, we brought the arm back in adduction at slight forward flexion with external rotation. We opened the clavipectoral fascia lateral to the conjoint tendon. We gently palpated the axillary nerve and verified its position and continuity on both sides of the humerus with a Tug test. Note, this test was repeated multiple times during the procedure for nerve localization and confirmed to be intact at the end of the case. We then cauterized the anterior humeral circumflex ("Three sisters") vessels. The arm was then internally rotated, we cut the falciform ligament at approximately 1 cm of the upper portion of the pectoralis major insertion. Next we unroofed the bicipital groove. The biceps tendon was tendinopathic with inflammatory synovium surrounding. We proceeded with a soft tissue biceps tenodesis given the pathology of the tendon.  After opening the biceps tendon sheath all the way to the supraglenoid tubercle, we performed a biceps tenodesis with two #2 TiCron sutures to the upper boarder of the pectoralis  major. The proximal portion of the tendon was excised.   At this point we visualized the rotator cuff, it was intact.   We performed a lesser tuberosity osteotomy. Next, we dissected the subscapularis off of the underlying capsule and then released the inferior capsule from the humerus all the way to the posterior band of the inferior glenohumeral ligament. When this was complete we gently dislocated the shoulder up into the wound. We removed the osteophytes and made our cut with the appropriate inclination in 30 degrees of retroversion.  The humerus was sounded and broached in the standard fashion and a protector plate was placed.  We then turned our attention back to the glenoid. A posterior retractor was used to retract the proximal humerus posteriorly. The remnant of the anterior capsule was excised, exposing the anterior glenoid. We then grasped the remaining stump of the biceps and removed the labrum circumferentially. During the glenoid exposure, the axillary nerve was protected the entire time. Capsular release was extended from the glenoid to past the posterior/inferior quadrant.    A patient-specific guide was used to drill the central guidepin. An appropriately sized reamer was used to ream the glenoid. Peripheral and central holes were drilled. The trial glenoid seated well.  There was no bony endpoint of the anterior peripheral hole so it was not cemented. We used thrombin soaked surgicell for hemostasis in the peripheral holes. Pressurized cement was used to fill the superior and posterior peripheral holes. The glenoid component was placed and it was well seated without any motion afterwards.   The humerus was trialed with the above components and noted to have satisfactory stability, motion, and subscapularis tension. The wound and humeral canal were thoroughly irrigated with pulse lavage. The final humeral component was then inserted. A Hemovac drain was placed. The lesser tuberosity osteotomy was repaired with #2 FiberWire with 3 sutures around the biceps groove and 1 around the prosthesis. Holes were drilled  and suture was passed prior to stem insertion. One suture of #2 TiCron was used to repair the lateral part of the rotator interval.  Lesser tuberosity osteotomy was stable to external and internal rotation.  We again verified the tension on the axillary nerve, appropriate range of motion, stability of the implant, and security of the subscapularis repair. We closed the deltopectoral interval deep to the cephalic vein with a running, 0-Vicryl suture. The skin was closed with 2-0 Vicryl and staples. Xeroform and Honeycomb dressing was applied. Patient was extubated, transferred to the post anesthesia care unit in stable condition.   Of note, assistance from a PA was essential to performing the surgery.  PA was present for the entire surgery.  PA assisted with patient positioning, retraction, instrumentation, and wound closure. The surgery would have been more difficult and had longer operative time without PA assistance.     POSTOPERATIVE PLAN: The patient will be discharged home from the PACU after physical and Occupational Therapy. Operative arm to remain in sling at all times except RoM exercises and hygiene. Can perform pendulums, elbow/wrist/hand RoM exercises. Passive RoM allowed to 140 FF and 40 ER. ASA 325mg  x 6 weeks for DVT ppx. Plan for outpatient PT starting on POD #3-4. Patient to return to clinic in ~2 weeks for post-operative appointment.

## 2020-02-21 NOTE — Progress Notes (Signed)
Dr. Lubertha Basque at bedside. Pt O2 sats remain in the 90's on room air while sitting. Dr. Lubertha Basque stated that since pt O2 sats remain in the 90's that pt is good to go home. No  New orders

## 2020-02-21 NOTE — Anesthesia Preprocedure Evaluation (Addendum)
Anesthesia Evaluation  Patient identified by MRN, date of birth, ID band Patient awake    Reviewed: Allergy & Precautions, NPO status , Patient's Chart, lab work & pertinent test results  History of Anesthesia Complications Negative for: history of anesthetic complications  Airway Mallampati: II  TM Distance: >3 FB Neck ROM: Full    Dental  (+) Upper Dentures, Partial Lower, Dental Advidsory Given   Pulmonary neg shortness of breath, asthma , sleep apnea (does not wear CPAP because she says it makes her sleep walk) , COPD,  COPD inhaler, neg recent URI, Current Smoker and Patient abstained from smoking.,    breath sounds clear to auscultation- rhonchi (-) wheezing      Cardiovascular Exercise Tolerance: Good hypertension, Pt. on medications (-) angina(-) CAD, (-) Past MI and (-) Cardiac Stents (-) dysrhythmias (-) Valvular Problems/Murmurs Rhythm:Regular Rate:Normal - Systolic murmurs and - Diastolic murmurs    Neuro/Psych neg Seizures PSYCHIATRIC DISORDERS Depression CVA, No Residual Symptoms    GI/Hepatic Neg liver ROS, GERD  ,  Endo/Other  negative endocrine ROSneg diabetes  Renal/GU Renal InsufficiencyRenal disease     Musculoskeletal  (+) Arthritis , Fibromyalgia -  Abdominal (+) + obese,   Peds  Hematology  (+) Blood dyscrasia, anemia ,   Anesthesia Other Findings Past Medical History: No date: Allergic rhinitis 04/14/2013: ASCVD (arteriosclerotic cardiovascular disease)     Comment: Overview:  A. CVA - 2008 B. Hyperlipidemia C.               Cigarettes No date: Asthma No date: B12 deficiency No date: Chickenpox 10/07/2013: Chronic kidney disease, stage III (moderate) No date: Connective tissue disease (HCC) No date: COPD (chronic obstructive pulmonary disease) (* 10/07/2013: COPD, mild (HCC) No date: Depression No date: Dysphagia No date: Fibromyalgia No date: GERD (gastroesophageal reflux disease) No  date: H/O degenerative disc disease 01/05/2007: History of embolic stroke without residual def* 10/07/2013: Hypercholesterolemia No date: Hypertension 10/07/2013: Impingement syndrome, shoulder     Comment: right No date: Nocturnal muscle cramps 10/07/2013: Obstructive sleep apnea on CPAP     Comment: Overview:  9 cm No date: OSA on CPAP     Comment: no longer wears cpap No date: Osteoarthritis     Comment: lupus No date: Pain     Comment: intermittent cheat pain x 1 mo since grand               daughter passes from brain tumor 08/23/2013: Primary osteoarthritis of right knee No date: Stroke [facility])     Comment: 2008  no residual 08/23/2013: Tear of medial cartilage or meniscus of knee, *     Comment: right No date: 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[place]) 10/07/2013: 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[place])     Comment: Overview:  S/P plasmapheresis 04/2004 at [facility] No date: Vitamin D deficiency   Reproductive/Obstetrics                             Anesthesia Physical  Anesthesia Plan  ASA: III  Anesthesia Plan: General   Post-op Pain Management:  Regional for Post-op pain   Induction: Intravenous  PONV Risk Score and Plan: Ondansetron, Dexamethasone, Midazolam, Treatment may vary due to age or medical condition and Promethazine  Airway Management Planned: Oral ETT  Additional Equipment:   Intra-op Plan:   Post-operative Plan: Extubation in OR  Informed Consent: I have reviewed the patients History and Physical, chart, labs and discussed the procedure including the  risks, benefits and alternatives for the proposed anesthesia with the patient or authorized representative who has indicated his/her understanding and acceptance.     Dental advisory given  Plan Discussed with: CRNA and Anesthesiologist  Anesthesia Plan Comments:        Anesthesia Quick Evaluation

## 2020-02-21 NOTE — Evaluation (Signed)
Physical Therapy Evaluation Patient Details Name: Bailey Dominguez MRN: [id number] DOB: 07/11/1955 Today's Date: 02/21/2020   History of Present Illness  Pt is a 65 yo female diagnosed with R shoulder OA and is s/p elective R TSA and biceps tenodesis.  PMH includes: HTN, COPD, GERD, CVA with no residual symptoms, depression, fibromyalgia, CKD III, dysphagia, DDD, and lupus.    Clinical Impression  Pt was pleasant and motivated to participate during the session.  Pt did not require physical assistance with any functional task and was steady with good control and stability with transfers, gait, and stair training.  Pt's SpO2 on rom air at rest varied but was generally in the low 90s.  Pt's SpO2 did drop to 87-88% after ambulation but quickly increased back to the 90s upon return to sitting.  Pt was able to ascend and descend one step x 3; pt was steady on the steps with good eccentric and concentric control and stabiltiy but SpO2 dropped to a low of 79% and gradually returned to 91% upon sitting after 45-60 sec, nursing notified.  From a functional perspective the patient performed very well and is appropriate for discharge home with planned OPPT per MD op note to address the deficits listed in patient problem list for decreased caregiver assistance and eventual return to PLOF.      Follow Up Recommendations Follow surgeon's recommendation for DC plan and follow-up therapies (Per OP note pt to begin OPPT POD#3-4)    Equipment Recommendations  None recommended by PT    Recommendations for Other Services       Precautions / Restrictions Precautions Precautions: Fall;Shoulder Shoulder Interventions: Shoulder sling/immobilizer;At all times;Off for dressing/bathing/exercises (off for exercises) Restrictions Weight Bearing Restrictions: Yes RUE Weight Bearing: Non weight bearing Other Position/Activity Restrictions: Per MD Op note: Can perform pendulums, elbow/wrist/hand RoM exercises. Passive RoM  allowed to 140 FF and 40 ER. (Of note, pt with biceps tenodesis, likely no R elbow AROM)      Mobility  Bed Mobility Overal bed mobility: Modified Independent             General bed mobility comments: Extra time and effort only    Transfers Overall transfer level: Needs assistance Equipment used: None Transfers: Sit to/from Stand Sit to Stand: Supervision         General transfer comment: Good eccentric and concentric control and stability to/from multiple height surfaces  Ambulation/Gait Ambulation/Gait assistance: Supervision Gait Distance (Feet): 40 Feet x 1, 15 Feet x 1 Assistive device: Straight cane Gait Pattern/deviations: Step-through pattern;Decreased step length - right;Decreased step length - left Gait velocity: decreased   General Gait Details: Slow cadence but steady without LOB; SpO2 on room air down from low 90s to 87-88% but quickly returned to the 90s upon sitting with PLB  Stairs Stairs: Yes Stairs assistance: Supervision Stair Management: One rail Left;Backwards;Forwards Number of Stairs: 1 General stair comments: Pt able to step up forwards and down backwards one step x 3 with training for sequencing secondary to pt stating subjectively that her LLE felt weaker than the RLE; pt was steady on the steps with good eccentric and concentric control and stabiltiy but SpO2 dropped to a low of 79% and gradually returned to 91% upon sitting after 45-60 sec  Wheelchair Mobility    Modified Rankin (Stroke Patients Only)       Balance Overall balance assessment: No apparent balance deficits (not formally assessed)  Pertinent Vitals/Pain Pain Assessment: No/denies pain    Home Living Family/patient expects to be discharged to:: Private residence Living Arrangements: Spouse/significant other Available Help at Discharge: Family;Available 24 hours/day Type of Home: Apartment Home Access:  Stairs to enter Entrance Stairs-Rails: None Entrance Stairs-Number of Steps: 1 Home Layout: One level Home Equipment: Cane - single point      Prior Function Level of Independence: Independent         Comments: Ind amb community distances without an AD, no fall history, Ind with ADLs     Hand Dominance        Extremity/Trunk Assessment   Upper Extremity Assessment Upper Extremity Assessment: RUE deficits/detail RUE: Unable to fully assess due to immobilization    Lower Extremity Assessment Lower Extremity Assessment: Overall WFL for tasks assessed       Communication   Communication: No difficulties  Cognition Arousal/Alertness: Awake/alert Behavior During Therapy: Flat affect Overall Cognitive Status: Within Functional Limits for tasks assessed                                        General Comments      Exercises Other Exercises: standing weight shifting left/right, marching in place x 10 sec Other Exercises: Pt education provided on proper sequencing with stair training, use of the SPC, and scanning environment to prevent bumping R shoulder on obstacles Other Exercises: Pt/nursing education provided on positioning RUE in sitting   Assessment/Plan    PT Assessment Patient needs continued PT services  PT Problem List Decreased strength;Decreased range of motion;Decreased mobility;Decreased knowledge of use of DME       PT Treatment Interventions DME instruction;Gait training;Stair training;Functional mobility training;Therapeutic activities;Therapeutic exercise;Balance training;Patient/family education    PT Goals (Current goals can be found in the Care Plan section)  Acute Rehab PT Goals Patient Stated Goal: To get back to fishing PT Goal Formulation: With patient Time For Goal Achievement: 03/05/20 Potential to Achieve Goals: Good    Frequency BID   Barriers to discharge        Co-evaluation               AM-PAC PT "6  Clicks" Mobility  Outcome Measure Help needed turning from your back to your side while in a flat bed without using bedrails?: A Little Help needed moving from lying on your back to sitting on the side of a flat bed without using bedrails?: A Little Help needed moving to and from a bed to a chair (including a wheelchair)?: A Little Help needed standing up from a chair using your arms (e.g., wheelchair or bedside chair)?: A Little Help needed to walk in hospital room?: A Little Help needed climbing 3-5 steps with a railing? : A Little 6 Click Score: 18    End of Session Equipment Utilized During Treatment: Gait belt Activity Tolerance: Patient tolerated treatment well;Other (comment) (Pt's O2 desaturated with activity but no adverse symptoms noted) Patient left: in chair;with nursing/sitter in room;Other (comment) (Pt with nurse in PACU at end of session) Nurse Communication: Mobility status;Other (comment) (O2 desaturation with activity) PT Visit Diagnosis: Muscle weakness (generalized) (M62.81)    Time: 7902-4097 PT Time Calculation (min) (ACUTE ONLY): 38 min   Charges:   PT Evaluation $PT Eval Moderate Complexity: 1 Mod PT Treatments $Gait Training: 8-22 mins        D. Royetta Asal PT, DPT 02/21/20, 4:58 PM

## 2020-02-21 NOTE — H&P (Signed)
Paper H&P to be scanned into permanent record. H&P reviewed. No significant changes noted.  

## 2020-02-21 NOTE — Progress Notes (Signed)
Pt up with PT. Pt sitting O2 sats 92-98%. When pt got up and started to walk O2 sats dropped to 87-88%. Pt then went and walked up stairs with PT and O2 sat dropped to 79% on room air. Once pt sat back down, O2 sats came back up in to the 90's. Pt stated not feeling short of breath when up and moving. Dr. Lubertha Basque notified. No new orders. Stated she would come see the pt.

## 2020-02-21 NOTE — Evaluation (Signed)
Occupational Therapy Evaluation Patient Details Name: Bailey Dominguez MRN: [id number] DOB: [birth year] Today's Date: 02/21/2020    History of Present Illness Pt is a 65 yo female diagnosed with R shoulder OA and is s/p elective R TSA and biceps tenodesis.  PMH includes: HTN, COPD, GERD, CVA with no residual symptoms, depression, fibromyalgia, CKD III, dysphagia, DDD, and lupus.   Clinical Impression   Patient was seen for an OT evaluation this date. Pt lives with spouse. Prior to surgery, pt was active and independent. Pt has orders for R UE to be immobilized and will be NWBing per MD. Patient presents with impaired strength/ROM, pain, and sensation to R UE with block not completely resolved yet. These impairments result in a decreased ability to perform self care tasks requiring MOD assist for UB/LB dressing and bathing and MAX assist for application of polar care, compression stockings, and sling/immobilizer. Pt instructed in polar care mgt, compression stockings mgt, sling/immobilizer mgt, ROM exercises for R UE (with instructions for no shoulder exercises until full sensation has returned), R UE precautions, adaptive strategies for bathing/dressing/toileting/grooming, positioning and considerations for sleep, and home/routines modifications to maximize falls prevention, safety, and independence. Handout provided. OT adjusted sling/immobilizer and polar care to improve comfort, optimize positioning, and to maximize skin integrity/safety. Pt verbalized understanding of all education/training provided. Pt will benefit from skilled OT services to address these limitations and improve independence in daily tasks. Recommend HHOT services to continue therapy to maximize return to PLOF, address home/routines modifications and safety, minimize falls risk, and minimize caregiver burden.      Follow Up Recommendations  Home health OT;Supervision/Assistance - 24 hour    Equipment Recommendations  None  recommended by OT    Recommendations for Other Services       Precautions / Restrictions Precautions Precautions: Fall;Shoulder Shoulder Interventions: Shoulder sling/immobilizer;At all times;Off for dressing/bathing/exercises Restrictions Weight Bearing Restrictions: Yes RUE Weight Bearing: Non weight bearing Other Position/Activity Restrictions: Per MD Op note: Can perform pendulums, elbow/wrist/hand RoM exercises. Passive RoM allowed to 140 FF and 40 ER. (Of note, pt with biceps tenodesis, likely no R elbow AROM)      Mobility Bed Mobility Overal bed mobility: Modified Independent             General bed mobility comments: up to chair pre/post session    Transfers Overall transfer level: Needs assistance Equipment used: None Transfers: Sit to/from Stand Sit to Stand: Supervision         General transfer comment: Good eccentric and concentric control and stability to/from multiple height surfaces    Balance Overall balance assessment: No apparent balance deficits (not formally assessed)                                         ADL either performed or assessed with clinical judgement   ADL Overall ADL's : Needs assistance/impaired                                       General ADL Comments: Pt requires MOD A for UB ADLs. MAX A for polar care and sling mgt. MAX A to don socks in sitting     Vision Patient Visual Report: No change from baseline       Perception     Praxis  Pertinent Vitals/Pain Pain Assessment: No/denies pain     Hand Dominance     Extremity/Trunk Assessment Upper Extremity Assessment Upper Extremity Assessment: RUE deficits/detail RUE: Unable to fully assess due to immobilization   Lower Extremity Assessment Lower Extremity Assessment: Overall WFL for tasks assessed       Communication Communication Communication: No difficulties   Cognition Arousal/Alertness: Awake/alert Behavior  During Therapy: Flat affect Overall Cognitive Status: Within Functional Limits for tasks assessed                                     General Comments  spO2 drop with light activity, 89-94% on RA at rest. RN aware. Okay'ed for d/c by MD    Exercises Other Exercises Other Exercises: OT facilitates ed re: role of OT in acute setting, safety considerations, polar care and sling mgt, modified UB ADL techniques. Other Exercises: Pt/nursing education provided on positioning RUE in sitting   Shoulder Instructions      Home Living Family/patient expects to be discharged to:: Private residence Living Arrangements: Spouse/significant other Available Help at Discharge: Family;Available 24 hours/day Type of Home: Apartment Home Access: Stairs to enter Entrance Stairs-Number of Steps: 1 Entrance Stairs-Rails: None Home Layout: One level     Bathroom Shower/Tub: Teacher, early years/pre: Standard     Home Equipment: Cane - single point          Prior Functioning/Environment Level of Independence: Independent        Comments: Ind amb community distances without an AD, no fall history, Ind with ADLs        OT Problem List: Decreased range of motion;Pain      OT Treatment/Interventions: Self-care/ADL training;Therapeutic activities    OT Goals(Current goals can be found in the care plan section) Acute Rehab OT Goals Patient Stated Goal: to be able to do more for myself and get this shoulder working as good as the left OT Goal Formulation: All assessment and education complete, DC therapy  OT Frequency: Min 1X/week   Barriers to D/C:            Co-evaluation              AM-PAC OT "6 Clicks" Daily Activity     Outcome Measure Help from another person eating meals?: None Help from another person taking care of personal grooming?: None Help from another person toileting, which includes using toliet, bedpan, or urinal?: A Lot Help from  another person bathing (including washing, rinsing, drying)?: A Lot Help from another person to put on and taking off regular upper body clothing?: A Lot Help from another person to put on and taking off regular lower body clothing?: A Lot 6 Click Score: 16   End of Session Equipment Utilized During Treatment: Other (comment) (sling) Nurse Communication: Mobility status  Activity Tolerance: Patient tolerated treatment well Patient left: in chair  OT Visit Diagnosis: Muscle weakness (generalized) (M62.81)                Time: 7628-3151 OT Time Calculation (min): 27 min Charges:  OT General Charges $OT Visit: 1 Visit OT Evaluation $OT Eval Moderate Complexity: 1 Mod OT Treatments $Self Care/Home Management : 8-22 mins  Gerrianne Scale, [place], OTR/L ascom [phone] 02/21/20, 5:39 PM

## 2020-02-22 ENCOUNTER — Encounter: Payer: Self-pay | Admitting: Orthopedic Surgery

## 2020-02-22 NOTE — Anesthesia Postprocedure Evaluation (Signed)
Anesthesia Post Note  Patient: Bailey Dominguez  Procedure(s) Performed: RIGHT TOTAL SHOULDER ARTHROPLASTY, BICEPS TENODESIS TODD MUNDY TO ASSIST (Right Shoulder)  Patient location during evaluation: PACU Anesthesia Type: General Level of consciousness: awake and alert Pain management: pain level controlled Vital Signs Assessment: post-procedure vital signs reviewed and stable Respiratory status: spontaneous breathing, nonlabored ventilation, respiratory function stable and patient connected to nasal cannula oxygen Cardiovascular status: blood pressure returned to baseline and stable Postop Assessment: no apparent nausea or vomiting Anesthetic complications: no   No complications documented.   Last Vitals:  Vitals:   02/21/20 1718 02/21/20 1742  BP: 139/88 (!) 166/55  Pulse: 65 62  Resp: 16 16  Temp: 36.6 C 36.8 C  SpO2: 95% 93%    Last Pain:  Vitals:   02/21/20 1742  TempSrc: Temporal  PainSc: 0-No pain                 Precious Haws Piscitello

## 2020-02-23 LAB — SURGICAL PATHOLOGY

## 2020-12-22 ENCOUNTER — Other Ambulatory Visit: Payer: Self-pay | Admitting: Family Medicine

## 2020-12-22 DIAGNOSIS — Z1231 Encounter for screening mammogram for malignant neoplasm of breast: Secondary | ICD-10-CM

## 2021-03-23 ENCOUNTER — Ambulatory Visit
Admission: RE | Admit: 2021-03-23 | Discharge: 2021-03-23 | Disposition: A | Discharge status: Home / Self Care | Payer: Medicare HMO | Source: Ambulatory Visit | Attending: Family Medicine | Admitting: Family Medicine

## 2021-03-23 ENCOUNTER — Other Ambulatory Visit: Payer: Self-pay

## 2021-03-23 DIAGNOSIS — Z1231 Encounter for screening mammogram for malignant neoplasm of breast: Secondary | ICD-10-CM | POA: Insufficient documentation

## 2021-10-01 DIAGNOSIS — R519 Headache, unspecified: Secondary | ICD-10-CM | POA: Diagnosis not present

## 2021-10-01 DIAGNOSIS — R410 Disorientation, unspecified: Secondary | ICD-10-CM | POA: Diagnosis not present

## 2021-10-01 DIAGNOSIS — Z8673 Personal history of transient ischemic attack (TIA), and cerebral infarction without residual deficits: Secondary | ICD-10-CM | POA: Diagnosis not present

## 2021-10-01 DIAGNOSIS — G4733 Obstructive sleep apnea (adult) (pediatric): Secondary | ICD-10-CM | POA: Diagnosis not present

## 2021-12-17 DIAGNOSIS — I10 Essential (primary) hypertension: Secondary | ICD-10-CM | POA: Diagnosis not present

## 2021-12-17 DIAGNOSIS — G4733 Obstructive sleep apnea (adult) (pediatric): Secondary | ICD-10-CM | POA: Diagnosis not present

## 2021-12-17 DIAGNOSIS — R7309 Other abnormal glucose: Secondary | ICD-10-CM | POA: Diagnosis not present

## 2021-12-17 DIAGNOSIS — E7800 Pure hypercholesterolemia, unspecified: Secondary | ICD-10-CM | POA: Diagnosis not present

## 2021-12-17 DIAGNOSIS — E559 Vitamin D deficiency, unspecified: Secondary | ICD-10-CM | POA: Diagnosis not present

## 2021-12-17 DIAGNOSIS — Z0000 Encounter for general adult medical examination without abnormal findings: Secondary | ICD-10-CM | POA: Diagnosis not present

## 2021-12-17 DIAGNOSIS — E538 Deficiency of other specified B group vitamins: Secondary | ICD-10-CM | POA: Diagnosis not present

## 2021-12-17 DIAGNOSIS — M159 Polyosteoarthritis, unspecified: Secondary | ICD-10-CM | POA: Diagnosis not present

## 2021-12-17 DIAGNOSIS — J449 Chronic obstructive pulmonary disease, unspecified: Secondary | ICD-10-CM | POA: Diagnosis not present

## 2021-12-19 DIAGNOSIS — M545 Low back pain, unspecified: Secondary | ICD-10-CM | POA: Diagnosis not present

## 2021-12-19 DIAGNOSIS — M8588 Other specified disorders of bone density and structure, other site: Secondary | ICD-10-CM | POA: Diagnosis not present

## 2021-12-19 DIAGNOSIS — M47816 Spondylosis without myelopathy or radiculopathy, lumbar region: Secondary | ICD-10-CM | POA: Diagnosis not present

## 2021-12-19 DIAGNOSIS — M79652 Pain in left thigh: Secondary | ICD-10-CM | POA: Diagnosis not present

## 2021-12-19 DIAGNOSIS — M79651 Pain in right thigh: Secondary | ICD-10-CM | POA: Diagnosis not present

## 2021-12-19 DIAGNOSIS — M359 Systemic involvement of connective tissue, unspecified: Secondary | ICD-10-CM | POA: Diagnosis not present

## 2021-12-24 DIAGNOSIS — G4730 Sleep apnea, unspecified: Secondary | ICD-10-CM | POA: Diagnosis not present

## 2021-12-24 DIAGNOSIS — E538 Deficiency of other specified B group vitamins: Secondary | ICD-10-CM | POA: Diagnosis not present

## 2021-12-24 DIAGNOSIS — K219 Gastro-esophageal reflux disease without esophagitis: Secondary | ICD-10-CM | POA: Diagnosis not present

## 2021-12-24 DIAGNOSIS — R7303 Prediabetes: Secondary | ICD-10-CM | POA: Diagnosis not present

## 2021-12-24 DIAGNOSIS — E559 Vitamin D deficiency, unspecified: Secondary | ICD-10-CM | POA: Diagnosis not present

## 2021-12-24 DIAGNOSIS — Z0000 Encounter for general adult medical examination without abnormal findings: Secondary | ICD-10-CM | POA: Diagnosis not present

## 2021-12-24 DIAGNOSIS — Z23 Encounter for immunization: Secondary | ICD-10-CM | POA: Diagnosis not present

## 2021-12-24 DIAGNOSIS — I10 Essential (primary) hypertension: Secondary | ICD-10-CM | POA: Diagnosis not present

## 2021-12-24 DIAGNOSIS — J449 Chronic obstructive pulmonary disease, unspecified: Secondary | ICD-10-CM | POA: Diagnosis not present

## 2021-12-30 ENCOUNTER — Emergency Department
Admission: EM | Admit: 2021-12-30 | Discharge: 2021-12-30 | Disposition: A | Discharge status: Home / Self Care | Payer: Medicare HMO | Attending: Emergency Medicine | Admitting: Emergency Medicine

## 2021-12-30 ENCOUNTER — Other Ambulatory Visit: Payer: Self-pay

## 2021-12-30 ENCOUNTER — Emergency Department: Payer: Medicare HMO

## 2021-12-30 DIAGNOSIS — J209 Acute bronchitis, unspecified: Secondary | ICD-10-CM

## 2021-12-30 DIAGNOSIS — R0602 Shortness of breath: Secondary | ICD-10-CM | POA: Diagnosis present

## 2021-12-30 DIAGNOSIS — R059 Cough, unspecified: Secondary | ICD-10-CM | POA: Diagnosis not present

## 2021-12-30 DIAGNOSIS — J440 Chronic obstructive pulmonary disease with acute lower respiratory infection: Secondary | ICD-10-CM | POA: Diagnosis not present

## 2021-12-30 DIAGNOSIS — Z20822 Contact with and (suspected) exposure to covid-19: Secondary | ICD-10-CM | POA: Insufficient documentation

## 2021-12-30 DIAGNOSIS — J441 Chronic obstructive pulmonary disease with (acute) exacerbation: Secondary | ICD-10-CM | POA: Diagnosis not present

## 2021-12-30 DIAGNOSIS — I129 Hypertensive chronic kidney disease with stage 1 through stage 4 chronic kidney disease, or unspecified chronic kidney disease: Secondary | ICD-10-CM | POA: Diagnosis not present

## 2021-12-30 DIAGNOSIS — N189 Chronic kidney disease, unspecified: Secondary | ICD-10-CM | POA: Insufficient documentation

## 2021-12-30 DIAGNOSIS — J449 Chronic obstructive pulmonary disease, unspecified: Secondary | ICD-10-CM

## 2021-12-30 LAB — RESP PANEL BY RT-PCR (FLU A&B, COVID) ARPGX2
Influenza A by PCR: NEGATIVE
Influenza B by PCR: NEGATIVE
SARS Coronavirus 2 by RT PCR: NEGATIVE

## 2021-12-30 MED ORDER — PREDNISONE 20 MG PO TABS
40.0000 mg | ORAL_TABLET | ORAL | Status: AC
  Administered 2021-12-30: 40 mg via ORAL
  Filled 2021-12-30: qty 2

## 2021-12-30 MED ORDER — PREDNISONE 20 MG PO TABS: 40.0000 mg | ORAL_TABLET | Freq: Every day | ORAL | 0 refills | Status: AC

## 2021-12-30 MED ORDER — GUAIFENESIN ER 600 MG PO TB12: 600.0000 mg | ORAL_TABLET | Freq: Two times a day (BID) | ORAL | 0 refills | Status: AC

## 2021-12-30 MED ORDER — DOXYCYCLINE HYCLATE 100 MG PO TABS
100.0000 mg | ORAL_TABLET | Freq: Once | ORAL | Status: AC
  Administered 2021-12-30: 100 mg via ORAL
  Filled 2021-12-30: qty 1

## 2021-12-30 MED ORDER — DOXYCYCLINE HYCLATE 100 MG PO CAPS: 100.0000 mg | ORAL_CAPSULE | Freq: Two times a day (BID) | ORAL | 0 refills | Status: AC

## 2021-12-30 MED ORDER — IPRATROPIUM-ALBUTEROL 0.5-2.5 (3) MG/3ML IN SOLN
3.0000 mL | Freq: Once | RESPIRATORY_TRACT | Status: AC
  Administered 2021-12-30: 3 mL via RESPIRATORY_TRACT
  Filled 2021-12-30: qty 3

## 2021-12-30 NOTE — ED Triage Notes (Signed)
Pt states she started feeling bad yesterday with a cold- pt states her son also has a cold- pt states when she coughs she feels like her throat is closing

## 2021-12-30 NOTE — ED Provider Notes (Signed)
[facility] Provider Note    Event Date/Time   First MD Initiated Contact with Patient 12/30/21 1710     (approximate)   History   Chief Complaint: Cough   HPI  Bailey Dominguez is a 66 y.o. female with a history of TTP, lupus, COPD, hypertension, CKD who comes ED complaining of malaise, fatigue, nonproductive cough that started yesterday, associated with a feeling of shortness of breath and chest tightness.  Not exertional, not pleuritic.  Tried using her inhaler at home without relief.  Patient's son also recently had viral illness within the last few days and she feels that the symptoms are similar.     Physical Exam   Triage Vital Signs: ED Triage Vitals  Enc Vitals Group     BP 12/30/21 1522 (!) 177/69     Pulse Rate 12/30/21 1522 68     Resp 12/30/21 1522 20     Temp 12/30/21 1525 97.7 F (36.5 C)     Temp Source 12/30/21 1525 Oral     SpO2 12/30/21 1522 97 %     Weight 12/30/21 1524 190 lb (86.2 kg)     Height 12/30/21 1524 '5\' 1"'$  (1.549 m)     Head Circumference --      Peak Flow --      Pain Score 12/30/21 1523 8     Pain Loc --      Pain Edu? --      Excl. in [place]? --     Most recent vital signs: Vitals:   12/30/21 1522 12/30/21 1525  BP: (!) 177/69   Pulse: 68   Resp: 20   Temp:  97.7 F (36.5 C)  SpO2: 97%     General: Awake, no distress.  CV:  Good peripheral perfusion.  Regular rate and rhythm Resp:  Normal effort.  Clear to auscultation bilaterally.  No inducible wheezing with FEV1 maneuver, but patient does have occasional wheezy cough. Abd:  No distention.  Soft nontender Other:  No lower extremity edema or calf tenderness.  Moist oral mucosa.   ED Results / Procedures / Treatments   Labs (all labs ordered are listed, but only abnormal results are displayed) Labs Reviewed  RESP PANEL BY RT-PCR (FLU A&B, COVID) [id number]     EKG    RADIOLOGY Chest x-ray interpreted by me, negative for consolidation or effusion.   Radiology report reviewed.   PROCEDURES:  Procedures   MEDICATIONS ORDERED IN ED: Medications  predniSONE (DELTASONE) tablet 40 mg (40 mg Oral Given 12/30/21 1750)  doxycycline (VIBRA-TABS) tablet 100 mg (100 mg Oral Given 12/30/21 1750)  ipratropium-albuterol (DUONEB) 0.5-2.5 (3) MG/3ML nebulizer solution 3 mL (3 mLs Nebulization Given 12/30/21 1751)     IMPRESSION / MDM / ASSESSMENT AND PLAN / ED COURSE  I reviewed the triage vital signs and the nursing notes.                              Differential diagnosis includes, but is not limited to, pneumonia, pneumothorax, pleural effusion, pulmonary edema, viral illness, COPD exacerbation  Patient's presentation is most consistent with acute presentation with potential threat to life or bodily function.  Patient presents with increased cough and shortness of breath with some intermittent audible wheeziness.  Chest x-ray COVID and flu are negative.  Vital signs unremarkable, oxygenation is normal.  She is not in distress and breathing comfortably.  Suspect viral illness causing  a mild COPD exacerbation.  Patient given prednisone and doxycycline DuoNeb in the ED and feeling much better.  We will continue these medications at home, follow-up with primary care.   Considering the patient's symptoms, medical history, and physical examination today, I have low suspicion for ACS, PE, TAD, pneumothorax, carditis, mediastinitis, pneumonia, CHF, or sepsis.        FINAL CLINICAL IMPRESSION(S) / ED DIAGNOSES   Final diagnoses:  Acute bronchitis, unspecified organism  Chronic obstructive pulmonary disease, unspecified COPD type ([place])     Rx / DC Orders   ED Discharge Orders          Ordered    doxycycline (VIBRAMYCIN) 100 MG capsule  2 times daily        12/30/21 1812    predniSONE (DELTASONE) 20 MG tablet  Daily with breakfast        12/30/21 1812    guaiFENesin (MUCINEX) 600 MG 12 hr tablet  2 times daily        12/30/21 1812              Note:  This document was prepared using Dragon voice recognition software and may include unintentional dictation errors.   Carrie Mew, MD 12/30/21 1816

## 2021-12-30 NOTE — ED Provider Triage Note (Signed)
  Emergency Medicine Provider Triage Evaluation Note  Bailey Dominguez , a 66 y.o.female,  was evaluated in triage.  Pt complains of cough/congestion.  Patient states that she feels like she has a cold, but does feel like her throat is "closing up" whenever she coughs.  In addition endorses some congestion in her chest as well.   Review of Systems  Positive: Cough, congestion. Negative: Denies fever, chest pain, vomiting  Physical Exam   Vitals:   12/30/21 1522  BP: (!) 177/69  Pulse: 68  Resp: 20  SpO2: 97%   Gen:   Awake, no distress   Resp:  Normal effort  MSK:   Moves extremities without difficulty  Other:  Audible sinus congestion in the room.  Medical Decision Making  Given the patient's initial medical screening exam, the following diagnostic evaluation has been ordered. The patient will be placed in the appropriate treatment space, once one is available, to complete the evaluation and treatment. I have discussed the plan of care with the patient and I have advised the patient that an ED physician or mid-level practitioner will reevaluate their condition after the test results have been received, as the results may give them additional insight into the type of treatment they may need.    Diagnostics: Respiratory panel, CXR  Treatments: none immediately   Teodoro Spray, [place] 12/30/21 1524

## 2022-01-09 DIAGNOSIS — E049 Nontoxic goiter, unspecified: Secondary | ICD-10-CM | POA: Diagnosis not present

## 2022-01-09 DIAGNOSIS — M8588 Other specified disorders of bone density and structure, other site: Secondary | ICD-10-CM | POA: Diagnosis not present

## 2022-02-07 DIAGNOSIS — G4733 Obstructive sleep apnea (adult) (pediatric): Secondary | ICD-10-CM | POA: Diagnosis not present

## 2022-03-05 DIAGNOSIS — I129 Hypertensive chronic kidney disease with stage 1 through stage 4 chronic kidney disease, or unspecified chronic kidney disease: Secondary | ICD-10-CM | POA: Diagnosis not present

## 2022-03-05 DIAGNOSIS — Z7902 Long term (current) use of antithrombotics/antiplatelets: Secondary | ICD-10-CM | POA: Diagnosis not present

## 2022-03-05 DIAGNOSIS — F17210 Nicotine dependence, cigarettes, uncomplicated: Secondary | ICD-10-CM | POA: Diagnosis not present

## 2022-03-05 DIAGNOSIS — G4733 Obstructive sleep apnea (adult) (pediatric): Secondary | ICD-10-CM | POA: Diagnosis not present

## 2022-03-05 DIAGNOSIS — N189 Chronic kidney disease, unspecified: Secondary | ICD-10-CM | POA: Diagnosis not present

## 2022-03-05 DIAGNOSIS — Z79899 Other long term (current) drug therapy: Secondary | ICD-10-CM | POA: Diagnosis not present

## 2022-03-05 DIAGNOSIS — Z885 Allergy status to narcotic agent status: Secondary | ICD-10-CM | POA: Diagnosis not present

## 2022-03-05 DIAGNOSIS — G4730 Sleep apnea, unspecified: Secondary | ICD-10-CM | POA: Diagnosis not present

## 2022-03-19 DIAGNOSIS — E7800 Pure hypercholesterolemia, unspecified: Secondary | ICD-10-CM | POA: Diagnosis not present

## 2022-03-19 DIAGNOSIS — R7303 Prediabetes: Secondary | ICD-10-CM | POA: Diagnosis not present

## 2022-03-19 DIAGNOSIS — I10 Essential (primary) hypertension: Secondary | ICD-10-CM | POA: Diagnosis not present

## 2022-03-19 DIAGNOSIS — G4733 Obstructive sleep apnea (adult) (pediatric): Secondary | ICD-10-CM | POA: Diagnosis not present

## 2022-03-19 DIAGNOSIS — J449 Chronic obstructive pulmonary disease, unspecified: Secondary | ICD-10-CM | POA: Diagnosis not present

## 2022-03-26 DIAGNOSIS — I10 Essential (primary) hypertension: Secondary | ICD-10-CM | POA: Diagnosis not present

## 2022-03-26 DIAGNOSIS — E559 Vitamin D deficiency, unspecified: Secondary | ICD-10-CM | POA: Diagnosis not present

## 2022-03-26 DIAGNOSIS — G4733 Obstructive sleep apnea (adult) (pediatric): Secondary | ICD-10-CM | POA: Diagnosis not present

## 2022-03-26 DIAGNOSIS — R7303 Prediabetes: Secondary | ICD-10-CM | POA: Diagnosis not present

## 2022-03-26 DIAGNOSIS — J449 Chronic obstructive pulmonary disease, unspecified: Secondary | ICD-10-CM | POA: Diagnosis not present

## 2022-03-26 DIAGNOSIS — M159 Polyosteoarthritis, unspecified: Secondary | ICD-10-CM | POA: Diagnosis not present

## 2022-03-26 DIAGNOSIS — E538 Deficiency of other specified B group vitamins: Secondary | ICD-10-CM | POA: Diagnosis not present

## 2022-03-26 DIAGNOSIS — K219 Gastro-esophageal reflux disease without esophagitis: Secondary | ICD-10-CM | POA: Diagnosis not present

## 2022-03-26 DIAGNOSIS — I2510 Atherosclerotic heart disease of native coronary artery without angina pectoris: Secondary | ICD-10-CM | POA: Diagnosis not present

## 2022-03-28 ENCOUNTER — Encounter: Payer: Self-pay | Admitting: Family Medicine

## 2022-03-28 DIAGNOSIS — M5136 Other intervertebral disc degeneration, lumbar region: Secondary | ICD-10-CM | POA: Diagnosis not present

## 2022-03-28 DIAGNOSIS — M5416 Radiculopathy, lumbar region: Secondary | ICD-10-CM | POA: Diagnosis not present

## 2022-03-29 ENCOUNTER — Other Ambulatory Visit: Payer: Self-pay | Admitting: Family Medicine

## 2022-03-29 DIAGNOSIS — M5416 Radiculopathy, lumbar region: Secondary | ICD-10-CM

## 2022-04-03 ENCOUNTER — Ambulatory Visit
Admission: RE | Admit: 2022-04-03 | Discharge: 2022-04-03 | Disposition: A | Discharge status: Home / Self Care | Payer: Medicare HMO | Source: Ambulatory Visit | Attending: Family Medicine | Admitting: Family Medicine

## 2022-04-03 DIAGNOSIS — M5416 Radiculopathy, lumbar region: Secondary | ICD-10-CM | POA: Diagnosis not present

## 2022-04-03 DIAGNOSIS — M5136 Other intervertebral disc degeneration, lumbar region: Secondary | ICD-10-CM | POA: Diagnosis not present

## 2022-04-10 ENCOUNTER — Other Ambulatory Visit: Payer: Medicare HMO

## 2022-04-16 ENCOUNTER — Ambulatory Visit: Payer: Medicare HMO | Attending: Otolaryngology

## 2022-04-16 DIAGNOSIS — G4733 Obstructive sleep apnea (adult) (pediatric): Secondary | ICD-10-CM | POA: Diagnosis not present

## 2022-04-16 DIAGNOSIS — Z7902 Long term (current) use of antithrombotics/antiplatelets: Secondary | ICD-10-CM | POA: Insufficient documentation

## 2022-04-16 DIAGNOSIS — G4761 Periodic limb movement disorder: Secondary | ICD-10-CM | POA: Insufficient documentation

## 2022-04-16 DIAGNOSIS — R4182 Altered mental status, unspecified: Secondary | ICD-10-CM | POA: Diagnosis not present

## 2022-04-16 DIAGNOSIS — Z7951 Long term (current) use of inhaled steroids: Secondary | ICD-10-CM | POA: Insufficient documentation

## 2022-04-16 DIAGNOSIS — M47892 Other spondylosis, cervical region: Secondary | ICD-10-CM | POA: Diagnosis not present

## 2022-04-16 DIAGNOSIS — G4736 Sleep related hypoventilation in conditions classified elsewhere: Secondary | ICD-10-CM | POA: Insufficient documentation

## 2022-04-16 DIAGNOSIS — H9190 Unspecified hearing loss, unspecified ear: Secondary | ICD-10-CM | POA: Insufficient documentation

## 2022-04-16 DIAGNOSIS — Z79899 Other long term (current) drug therapy: Secondary | ICD-10-CM | POA: Diagnosis not present

## 2022-04-16 DIAGNOSIS — Z8673 Personal history of transient ischemic attack (TIA), and cerebral infarction without residual deficits: Secondary | ICD-10-CM | POA: Insufficient documentation

## 2022-04-20 DIAGNOSIS — G4733 Obstructive sleep apnea (adult) (pediatric): Secondary | ICD-10-CM | POA: Diagnosis not present

## 2022-04-24 ENCOUNTER — Other Ambulatory Visit: Payer: Self-pay | Admitting: Family Medicine

## 2022-04-24 DIAGNOSIS — Z1231 Encounter for screening mammogram for malignant neoplasm of breast: Secondary | ICD-10-CM

## 2022-04-25 DIAGNOSIS — M5136 Other intervertebral disc degeneration, lumbar region: Secondary | ICD-10-CM | POA: Diagnosis not present

## 2022-04-25 DIAGNOSIS — M47816 Spondylosis without myelopathy or radiculopathy, lumbar region: Secondary | ICD-10-CM | POA: Diagnosis not present

## 2022-05-14 ENCOUNTER — Ambulatory Visit
Admission: RE | Admit: 2022-05-14 | Discharge: 2022-05-14 | Disposition: A | Discharge status: Home / Self Care | Payer: Medicare HMO | Source: Ambulatory Visit | Attending: Family Medicine | Admitting: Family Medicine

## 2022-05-14 DIAGNOSIS — Z1231 Encounter for screening mammogram for malignant neoplasm of breast: Secondary | ICD-10-CM | POA: Insufficient documentation

## 2022-06-18 DIAGNOSIS — G4733 Obstructive sleep apnea (adult) (pediatric): Secondary | ICD-10-CM | POA: Diagnosis not present

## 2022-06-18 DIAGNOSIS — E559 Vitamin D deficiency, unspecified: Secondary | ICD-10-CM | POA: Diagnosis not present

## 2022-06-18 DIAGNOSIS — M159 Polyosteoarthritis, unspecified: Secondary | ICD-10-CM | POA: Diagnosis not present

## 2022-06-18 DIAGNOSIS — E538 Deficiency of other specified B group vitamins: Secondary | ICD-10-CM | POA: Diagnosis not present

## 2022-06-18 DIAGNOSIS — J449 Chronic obstructive pulmonary disease, unspecified: Secondary | ICD-10-CM | POA: Diagnosis not present

## 2022-06-18 DIAGNOSIS — R7303 Prediabetes: Secondary | ICD-10-CM | POA: Diagnosis not present

## 2022-06-18 DIAGNOSIS — E7800 Pure hypercholesterolemia, unspecified: Secondary | ICD-10-CM | POA: Diagnosis not present

## 2022-06-18 DIAGNOSIS — M5136 Other intervertebral disc degeneration, lumbar region: Secondary | ICD-10-CM | POA: Diagnosis not present

## 2022-06-18 DIAGNOSIS — K219 Gastro-esophageal reflux disease without esophagitis: Secondary | ICD-10-CM | POA: Diagnosis not present

## 2022-06-19 DIAGNOSIS — Z79899 Other long term (current) drug therapy: Secondary | ICD-10-CM | POA: Diagnosis not present

## 2022-06-19 DIAGNOSIS — M359 Systemic involvement of connective tissue, unspecified: Secondary | ICD-10-CM | POA: Diagnosis not present

## 2022-06-25 DIAGNOSIS — N289 Disorder of kidney and ureter, unspecified: Secondary | ICD-10-CM | POA: Diagnosis not present

## 2022-06-25 DIAGNOSIS — G4730 Sleep apnea, unspecified: Secondary | ICD-10-CM | POA: Diagnosis not present

## 2022-06-25 DIAGNOSIS — R7303 Prediabetes: Secondary | ICD-10-CM | POA: Diagnosis not present

## 2022-06-25 DIAGNOSIS — E538 Deficiency of other specified B group vitamins: Secondary | ICD-10-CM | POA: Diagnosis not present

## 2022-07-09 DIAGNOSIS — R410 Disorientation, unspecified: Secondary | ICD-10-CM | POA: Diagnosis not present

## 2022-07-09 DIAGNOSIS — G4733 Obstructive sleep apnea (adult) (pediatric): Secondary | ICD-10-CM | POA: Diagnosis not present

## 2022-07-09 DIAGNOSIS — Z8673 Personal history of transient ischemic attack (TIA), and cerebral infarction without residual deficits: Secondary | ICD-10-CM | POA: Diagnosis not present

## 2022-07-09 DIAGNOSIS — R519 Headache, unspecified: Secondary | ICD-10-CM | POA: Diagnosis not present

## 2022-07-30 DIAGNOSIS — M47816 Spondylosis without myelopathy or radiculopathy, lumbar region: Secondary | ICD-10-CM | POA: Diagnosis not present

## 2022-07-30 DIAGNOSIS — M5136 Other intervertebral disc degeneration, lumbar region: Secondary | ICD-10-CM | POA: Diagnosis not present

## 2022-10-01 DIAGNOSIS — K31A0 Gastric intestinal metaplasia, unspecified: Secondary | ICD-10-CM | POA: Diagnosis not present

## 2022-10-01 DIAGNOSIS — Z800 Family history of malignant neoplasm of digestive organs: Secondary | ICD-10-CM | POA: Diagnosis not present

## 2022-10-01 DIAGNOSIS — Z86010 Personal history of colonic polyps: Secondary | ICD-10-CM | POA: Diagnosis not present

## 2022-12-13 ENCOUNTER — Encounter: Payer: Self-pay | Admitting: *Deleted

## 2022-12-16 DIAGNOSIS — G4733 Obstructive sleep apnea (adult) (pediatric): Secondary | ICD-10-CM | POA: Diagnosis not present

## 2022-12-16 DIAGNOSIS — R7303 Prediabetes: Secondary | ICD-10-CM | POA: Diagnosis not present

## 2022-12-16 DIAGNOSIS — I10 Essential (primary) hypertension: Secondary | ICD-10-CM | POA: Diagnosis not present

## 2022-12-16 DIAGNOSIS — R7309 Other abnormal glucose: Secondary | ICD-10-CM | POA: Diagnosis not present

## 2022-12-16 DIAGNOSIS — E7800 Pure hypercholesterolemia, unspecified: Secondary | ICD-10-CM | POA: Diagnosis not present

## 2022-12-16 DIAGNOSIS — E559 Vitamin D deficiency, unspecified: Secondary | ICD-10-CM | POA: Diagnosis not present

## 2022-12-16 DIAGNOSIS — K219 Gastro-esophageal reflux disease without esophagitis: Secondary | ICD-10-CM | POA: Diagnosis not present

## 2022-12-16 DIAGNOSIS — J449 Chronic obstructive pulmonary disease, unspecified: Secondary | ICD-10-CM | POA: Diagnosis not present

## 2022-12-16 DIAGNOSIS — M150 Primary generalized (osteo)arthritis: Secondary | ICD-10-CM | POA: Diagnosis not present

## 2022-12-16 DIAGNOSIS — Z01818 Encounter for other preprocedural examination: Secondary | ICD-10-CM | POA: Diagnosis not present

## 2022-12-16 DIAGNOSIS — E538 Deficiency of other specified B group vitamins: Secondary | ICD-10-CM | POA: Diagnosis not present

## 2022-12-23 ENCOUNTER — Encounter: Payer: Self-pay | Admitting: *Deleted

## 2022-12-26 DIAGNOSIS — E785 Hyperlipidemia, unspecified: Secondary | ICD-10-CM | POA: Diagnosis not present

## 2022-12-26 DIAGNOSIS — E538 Deficiency of other specified B group vitamins: Secondary | ICD-10-CM | POA: Diagnosis not present

## 2022-12-26 DIAGNOSIS — E559 Vitamin D deficiency, unspecified: Secondary | ICD-10-CM | POA: Diagnosis not present

## 2022-12-26 DIAGNOSIS — R7303 Prediabetes: Secondary | ICD-10-CM | POA: Diagnosis not present

## 2022-12-26 DIAGNOSIS — Z0000 Encounter for general adult medical examination without abnormal findings: Secondary | ICD-10-CM | POA: Diagnosis not present

## 2022-12-30 ENCOUNTER — Ambulatory Visit: Payer: Medicare HMO | Admitting: Certified Registered"

## 2022-12-30 ENCOUNTER — Ambulatory Visit
Admission: RE | Admit: 2022-12-30 | Discharge: 2022-12-30 | Disposition: A | Discharge status: Home / Self Care | Payer: Medicare HMO | Attending: Gastroenterology | Admitting: Gastroenterology

## 2022-12-30 ENCOUNTER — Encounter
Admission: RE | Disposition: A | Discharge status: Home / Self Care | Payer: Self-pay | Source: Home / Self Care | Attending: Gastroenterology

## 2022-12-30 DIAGNOSIS — K31A11 Gastric intestinal metaplasia without dysplasia, involving the antrum: Secondary | ICD-10-CM | POA: Insufficient documentation

## 2022-12-30 DIAGNOSIS — K640 First degree hemorrhoids: Secondary | ICD-10-CM | POA: Insufficient documentation

## 2022-12-30 DIAGNOSIS — J449 Chronic obstructive pulmonary disease, unspecified: Secondary | ICD-10-CM | POA: Diagnosis not present

## 2022-12-30 DIAGNOSIS — K3189 Other diseases of stomach and duodenum: Secondary | ICD-10-CM | POA: Diagnosis not present

## 2022-12-30 DIAGNOSIS — E669 Obesity, unspecified: Secondary | ICD-10-CM | POA: Insufficient documentation

## 2022-12-30 DIAGNOSIS — K31A19 Gastric intestinal metaplasia without dysplasia, unspecified site: Secondary | ICD-10-CM | POA: Diagnosis not present

## 2022-12-30 DIAGNOSIS — Z791 Long term (current) use of non-steroidal anti-inflammatories (NSAID): Secondary | ICD-10-CM | POA: Insufficient documentation

## 2022-12-30 DIAGNOSIS — F32A Depression, unspecified: Secondary | ICD-10-CM | POA: Insufficient documentation

## 2022-12-30 DIAGNOSIS — M797 Fibromyalgia: Secondary | ICD-10-CM | POA: Insufficient documentation

## 2022-12-30 DIAGNOSIS — Z8673 Personal history of transient ischemic attack (TIA), and cerebral infarction without residual deficits: Secondary | ICD-10-CM | POA: Insufficient documentation

## 2022-12-30 DIAGNOSIS — Z860101 Personal history of adenomatous and serrated colon polyps: Secondary | ICD-10-CM | POA: Diagnosis not present

## 2022-12-30 DIAGNOSIS — K219 Gastro-esophageal reflux disease without esophagitis: Secondary | ICD-10-CM | POA: Insufficient documentation

## 2022-12-30 DIAGNOSIS — Z860100 Personal history of colon polyps, unspecified: Secondary | ICD-10-CM | POA: Diagnosis not present

## 2022-12-30 DIAGNOSIS — Z6837 Body mass index (BMI) 37.0-37.9, adult: Secondary | ICD-10-CM | POA: Diagnosis not present

## 2022-12-30 DIAGNOSIS — I129 Hypertensive chronic kidney disease with stage 1 through stage 4 chronic kidney disease, or unspecified chronic kidney disease: Secondary | ICD-10-CM | POA: Insufficient documentation

## 2022-12-30 DIAGNOSIS — D649 Anemia, unspecified: Secondary | ICD-10-CM | POA: Insufficient documentation

## 2022-12-30 DIAGNOSIS — Z800 Family history of malignant neoplasm of digestive organs: Secondary | ICD-10-CM | POA: Diagnosis not present

## 2022-12-30 DIAGNOSIS — K2960 Other gastritis without bleeding: Secondary | ICD-10-CM | POA: Diagnosis not present

## 2022-12-30 DIAGNOSIS — K5730 Diverticulosis of large intestine without perforation or abscess without bleeding: Secondary | ICD-10-CM | POA: Insufficient documentation

## 2022-12-30 DIAGNOSIS — G4733 Obstructive sleep apnea (adult) (pediatric): Secondary | ICD-10-CM | POA: Insufficient documentation

## 2022-12-30 DIAGNOSIS — N183 Chronic kidney disease, stage 3 unspecified: Secondary | ICD-10-CM | POA: Insufficient documentation

## 2022-12-30 DIAGNOSIS — K635 Polyp of colon: Secondary | ICD-10-CM | POA: Diagnosis not present

## 2022-12-30 DIAGNOSIS — Z7902 Long term (current) use of antithrombotics/antiplatelets: Secondary | ICD-10-CM | POA: Diagnosis not present

## 2022-12-30 DIAGNOSIS — E7800 Pure hypercholesterolemia, unspecified: Secondary | ICD-10-CM | POA: Diagnosis not present

## 2022-12-30 DIAGNOSIS — Z79899 Other long term (current) drug therapy: Secondary | ICD-10-CM | POA: Insufficient documentation

## 2022-12-30 DIAGNOSIS — F17210 Nicotine dependence, cigarettes, uncomplicated: Secondary | ICD-10-CM | POA: Diagnosis not present

## 2022-12-30 DIAGNOSIS — Z1211 Encounter for screening for malignant neoplasm of colon: Secondary | ICD-10-CM | POA: Diagnosis not present

## 2022-12-30 DIAGNOSIS — M1990 Unspecified osteoarthritis, unspecified site: Secondary | ICD-10-CM | POA: Diagnosis not present

## 2022-12-30 DIAGNOSIS — I2510 Atherosclerotic heart disease of native coronary artery without angina pectoris: Secondary | ICD-10-CM | POA: Insufficient documentation

## 2022-12-30 DIAGNOSIS — K2950 Unspecified chronic gastritis without bleeding: Secondary | ICD-10-CM | POA: Diagnosis not present

## 2022-12-30 DIAGNOSIS — D123 Benign neoplasm of transverse colon: Secondary | ICD-10-CM | POA: Diagnosis not present

## 2022-12-30 DIAGNOSIS — D120 Benign neoplasm of cecum: Secondary | ICD-10-CM | POA: Insufficient documentation

## 2022-12-30 HISTORY — DX: Disorientation, unspecified: R41.0

## 2022-12-30 HISTORY — PX: POLYPECTOMY: SHX5525

## 2022-12-30 HISTORY — PX: COLONOSCOPY WITH PROPOFOL: SHX5780

## 2022-12-30 HISTORY — DX: Unspecified convulsions: R56.9

## 2022-12-30 HISTORY — PX: ESOPHAGOGASTRODUODENOSCOPY (EGD) WITH PROPOFOL: SHX5813

## 2022-12-30 HISTORY — DX: Other specified postprocedural states: Z98.890

## 2022-12-30 HISTORY — DX: Dyspnea, unspecified: R06.00

## 2022-12-30 HISTORY — PX: BIOPSY: SHX5522

## 2022-12-30 HISTORY — DX: Headache, unspecified: R51.9

## 2022-12-30 HISTORY — DX: Primary osteoarthritis, right shoulder: M19.011

## 2022-12-30 HISTORY — DX: Prediabetes: R73.03

## 2022-12-30 SURGERY — COLONOSCOPY WITH PROPOFOL
Anesthesia: General

## 2022-12-30 MED ORDER — SODIUM CHLORIDE 0.9 % IV SOLN
INTRAVENOUS | Status: DC
  Administered 2022-12-30: 20 mL/h via INTRAVENOUS

## 2022-12-30 MED ORDER — LIDOCAINE HCL (CARDIAC) PF 100 MG/5ML IV SOSY
PREFILLED_SYRINGE | INTRAVENOUS | Status: DC | PRN
  Administered 2022-12-30: 100 mg via INTRAVENOUS

## 2022-12-30 MED ORDER — PROPOFOL 10 MG/ML IV BOLUS
INTRAVENOUS | Status: DC | PRN
  Administered 2022-12-30: 80 mg via INTRAVENOUS

## 2022-12-30 MED ORDER — PROPOFOL 500 MG/50ML IV EMUL
INTRAVENOUS | Status: DC | PRN
  Administered 2022-12-30: 150 ug/kg/min via INTRAVENOUS

## 2022-12-30 NOTE — Transfer of Care (Signed)
Immediate Anesthesia Transfer of Care Note  Patient: Bailey Dominguez  Procedure(s) Performed: COLONOSCOPY WITH PROPOFOL ESOPHAGOGASTRODUODENOSCOPY (EGD) WITH PROPOFOL BIOPSY POLYPECTOMY  Patient Location: Endoscopy Unit  Anesthesia Type:General  Level of Consciousness: awake and drowsy  Airway & Oxygen Therapy: Patient Spontanous Breathing  Post-op Assessment: Report given to RN and Post -op Vital signs reviewed and stable  Post vital signs: Reviewed and stable  Last Vitals:  Vitals Value Taken Time  BP 117/48 12/30/22 0917  Temp    Pulse 62 12/30/22 0918  Resp 24 12/30/22 0918  SpO2 96 % 12/30/22 0918    Last Pain:  Vitals:   12/30/22 0917  TempSrc:   PainSc: Asleep         Complications: No notable events documented.

## 2022-12-30 NOTE — Anesthesia Preprocedure Evaluation (Signed)
Anesthesia Evaluation  Patient identified by MRN, date of birth, ID band Patient awake    Reviewed: Allergy & Precautions, NPO status , Patient's Chart, lab work & pertinent test results  History of Anesthesia Complications Negative for: history of anesthetic complications  Airway Mallampati: II  TM Distance: >3 FB Neck ROM: Full    Dental  (+) Upper Dentures, Partial Lower, Dental Advidsory Given   Pulmonary neg shortness of breath, asthma , sleep apnea (does not wear CPAP because she says it makes her sleep walk) , COPD,  COPD inhaler, neg recent URI, Current Smoker and Patient abstained from smoking.   breath sounds clear to auscultation- rhonchi (-) wheezing      Cardiovascular Exercise Tolerance: Good hypertension, Pt. on medications (-) angina (-) CAD, (-) Past MI and (-) Cardiac Stents (-) dysrhythmias (-) Valvular Problems/Murmurs Rhythm:Regular Rate:Normal - Systolic murmurs and - Diastolic murmurs    Neuro/Psych neg Seizures PSYCHIATRIC DISORDERS  Depression    CVA, No Residual Symptoms    GI/Hepatic Neg liver ROS,GERD  ,,  Endo/Other  negative endocrine ROSneg diabetes    Renal/GU Renal InsufficiencyRenal disease     Musculoskeletal  (+) Arthritis ,  Fibromyalgia -  Abdominal  (+) + obese  Peds  Hematology  (+) Blood dyscrasia, anemia   Anesthesia Other Findings Past Medical History: No date: Allergic rhinitis 04/14/2013: ASCVD (arteriosclerotic cardiovascular disease)     Comment: Overview:  A. CVA - 2008 B. Hyperlipidemia C.               Cigarettes No date: Asthma No date: B12 deficiency No date: Chickenpox 10/07/2013: Chronic kidney disease, stage III (moderate) No date: Connective tissue disease (HCC) No date: COPD (chronic obstructive pulmonary disease) (* 10/07/2013: COPD, mild (HCC) No date: Depression No date: Dysphagia No date: Fibromyalgia No date: GERD (gastroesophageal reflux disease) No  date: H/O degenerative disc disease 01/05/2007: History of embolic stroke without residual def* 10/07/2013: Hypercholesterolemia No date: Hypertension 10/07/2013: Impingement syndrome, shoulder     Comment: right No date: Nocturnal muscle cramps 10/07/2013: Obstructive sleep apnea on CPAP     Comment: Overview:  9 cm No date: OSA on CPAP     Comment: no longer wears cpap No date: Osteoarthritis     Comment: lupus No date: Pain     Comment: intermittent cheat pain x 1 mo since grand               daughter passes from brain tumor 08/23/2013: Primary osteoarthritis of right knee No date: Stroke [facility])     Comment: 2008  no residual 08/23/2013: Tear of medial cartilage or meniscus of knee, *     Comment: right No date: 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HCC) 10/07/2013: 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HCC)     Comment: Overview:  S/P plasmapheresis 04/2004 at [facility] No date: Vitamin D deficiency   Reproductive/Obstetrics                             Anesthesia Physical Anesthesia Plan  ASA: 3  Anesthesia Plan: General   Post-op Pain Management:  Regional for Post-op pain   Induction: Intravenous  PONV Risk Score and Plan: 2 and Ondansetron, Propofol infusion and TIVA  Airway Management Planned: Nasal Cannula and Natural Airway  Additional Equipment:   Intra-op Plan:   Post-operative Plan: Extubation in OR  Informed Consent: I have reviewed the patients History and Physical, chart, labs and discussed the procedure including the  risks, benefits and alternatives for the proposed anesthesia with the patient or authorized representative who has indicated his/her understanding and acceptance.     Dental advisory given  Plan Discussed with: CRNA and Anesthesiologist  Anesthesia Plan Comments: (Patient consented for risks of anesthesia including but not limited to:  - adverse reactions to medications - risk of airway placement if required -  damage to eyes, teeth, lips or other oral mucosa - nerve damage due to positioning  - sore throat or hoarseness - Damage to heart, brain, nerves, lungs, other parts of body or loss of life  Patient voiced understanding and assent.)        Anesthesia Quick Evaluation

## 2022-12-30 NOTE — Op Note (Signed)
[facility] Patient Name: Bailey Dominguez Procedure Date: 12/30/2022 8:38 AM MRN: [id number] Account #: [id number] Date of Birth: [birth year] Admit Type: Outpatient Age: 67 Room: [facility] ENDO ROOM 3 Gender: Female Note Status: Finalized Instrument Name: Prentice Docker [id number] Procedure:             Colonoscopy Indications:           Surveillance: Personal history of adenomatous polyps                         on last colonoscopy 5 years ago, Family history of                         colon cancer in a first-degree relative before age 60                         years Providers:             Eather Colas MD, MD Referring MD:          Lisabeth Pick. Fields (Referring MD) Medicines:             Monitored Anesthesia Care Complications:         No immediate complications. Estimated blood loss:                         Minimal. Procedure:             Pre-Anesthesia Assessment:                        - Prior to the procedure, a History and Physical was                         performed, and patient medications and allergies were                         reviewed. The patient is competent. The risks and                         benefits of the procedure and the sedation options and                         risks were discussed with the patient. All questions                         were answered and informed consent was obtained.                         Patient identification and proposed procedure were                         verified by the physician, the nurse, the                         anesthesiologist, the anesthetist and the technician                         in the endoscopy suite. Mental Status Examination:  alert and oriented. Airway Examination: normal                         oropharyngeal airway and neck mobility. Respiratory                         Examination: clear to auscultation. CV Examination:                         normal.  Prophylactic Antibiotics: The patient does not                         require prophylactic antibiotics. Prior                         Anticoagulants: The patient has taken no anticoagulant                         or antiplatelet agents. ASA Grade Assessment: III - A                         patient with severe systemic disease. After reviewing                         the risks and benefits, the patient was deemed in                         satisfactory condition to undergo the procedure. The                         anesthesia plan was to use monitored anesthesia care                         (MAC). Immediately prior to administration of                         medications, the patient was re-assessed for adequacy                         to receive sedatives. The heart rate, respiratory                         rate, oxygen saturations, blood pressure, adequacy of                         pulmonary ventilation, and response to care were                         monitored throughout the procedure. The physical                         status of the patient was re-assessed after the                         procedure.                        After obtaining informed consent, the colonoscope was  passed under direct vision. Throughout the procedure,                         the patient's blood pressure, pulse, and oxygen                         saturations were monitored continuously. The                         Colonoscope was introduced through the anus and                         advanced to the the cecum, identified by appendiceal                         orifice and ileocecal valve. The colonoscopy was                         performed without difficulty. The patient tolerated                         the procedure well. The quality of the bowel                         preparation was adequate to identify polyps. The                         ileocecal valve, appendiceal  orifice, and rectum were                         photographed. Findings:      The perianal and digital rectal examinations were normal.      A 2 mm polyp was found in the cecum. The polyp was sessile. The polyp       was removed with a cold snare. Resection and retrieval were complete.       Estimated blood loss was minimal.      A single large-mouthed diverticulum was found in the ascending colon.      A 1 mm polyp was found in the transverse colon. The polyp was sessile.       The polyp was removed with a jumbo cold forceps. Resection and retrieval       were complete. Estimated blood loss was minimal.      Internal hemorrhoids were found during retroflexion. The hemorrhoids       were Grade I (internal hemorrhoids that do not prolapse).      The exam was otherwise without abnormality on direct and retroflexion       views. Impression:            - One 2 mm polyp in the cecum, removed with a cold                         snare. Resected and retrieved.                        - Diverticulosis in the ascending colon.                        - One 1 mm polyp in the transverse  colon, removed with                         a jumbo cold forceps. Resected and retrieved.                        - Internal hemorrhoids.                        - The examination was otherwise normal on direct and                         retroflexion views. Recommendation:        - Discharge patient to home.                        - Resume previous diet.                        - Continue present medications.                        - Await pathology results.                        - Repeat colonoscopy in 5 years for surveillance.                        - Return to referring physician as previously                         scheduled. Procedure Code(s):     --- Professional ---                        [phone], Colonoscopy, flexible; with removal of                         tumor(s), polyp(s), or other lesion(s) by snare                          technique                        45380, 59, Colonoscopy, flexible; with biopsy, single                         or multiple Diagnosis Code(s):     --- Professional ---                        Z86.010, Personal history of colonic polyps                        D12.0, Benign neoplasm of cecum                        D12.3, Benign neoplasm of transverse colon (hepatic                         flexure or splenic flexure)                        K64.0, First degree hemorrhoids  Z80.0, Family history of malignant neoplasm of                         digestive organs                        K57.30, Diverticulosis of large intestine without                         perforation or abscess without bleeding CPT copyright 2022 American [employer]. All rights reserved. The codes documented in this report are preliminary and upon coder review may  be revised to meet current compliance requirements. Eather Colas MD, MD 12/30/2022 9:20:04 AM Number of Addenda: 0 Note Initiated On: 12/30/2022 8:38 AM Scope Withdrawal Time: 0 hours 8 minutes 39 seconds  Total Procedure Duration: 0 hours 11 minutes 50 seconds  Estimated Blood Loss:  Estimated blood loss was minimal.      [facility]

## 2022-12-30 NOTE — Interval H&P Note (Signed)
History and Physical Interval Note:  12/30/2022 8:43 AM  Bailey Dominguez  has presented today for surgery, with the diagnosis of hx of adenomatous polyp of colon,intestinal metaplasia of gastric mucosa.  The various methods of treatment have been discussed with the patient and family. After consideration of risks, benefits and other options for treatment, the patient has consented to  Procedure(s): COLONOSCOPY WITH PROPOFOL (N/A) ESOPHAGOGASTRODUODENOSCOPY (EGD) WITH PROPOFOL (N/A) as a surgical intervention.  The patient's history has been reviewed, patient examined, no change in status, stable for surgery.  I have reviewed the patient's chart and labs.  Questions were answered to the patient's satisfaction.     Regis Bill  Ok to proceed with EGD/Colonoscopy

## 2022-12-30 NOTE — Anesthesia Postprocedure Evaluation (Signed)
Anesthesia Post Note  Patient: Bailey Dominguez  Procedure(s) Performed: COLONOSCOPY WITH PROPOFOL ESOPHAGOGASTRODUODENOSCOPY (EGD) WITH PROPOFOL BIOPSY POLYPECTOMY  Patient location during evaluation: Endoscopy Anesthesia Type: General Level of consciousness: awake and alert Pain management: pain level controlled Vital Signs Assessment: post-procedure vital signs reviewed and stable Respiratory status: spontaneous breathing, nonlabored ventilation, respiratory function stable and patient connected to nasal cannula oxygen Cardiovascular status: blood pressure returned to baseline and stable Postop Assessment: no apparent nausea or vomiting Anesthetic complications: no  No notable events documented.   Last Vitals:  Vitals:   12/30/22 0918 12/30/22 0927  BP:  (!) 123/53  Pulse: 62 61  Resp: (!) 24 19  Temp:  (!) 35.2 C  SpO2: 96% 95%    Last Pain:  Vitals:   12/30/22 0927  TempSrc: Temporal  PainSc: 0-No pain                 Stephanie Coup

## 2022-12-30 NOTE — H&P (Signed)
Outpatient short stay form Pre-procedure 12/30/2022  Regis Bill, MD  Primary Physician: Alm Bustard, NP  Reason for visit:  GIM/Surveillance colonoscopy  History of present illness:    67 y/o lady with history of hypertension, CAD, OSA, and CKD here for EGD for intestinal metaplasia and colonoscopy for history of TA. Sister had colon cancer in her 88's. Takes plavix with last dose 6 days ago. History of cholecystectomy and hysterectomy.    Current Facility-Administered Medications:    0.9 %  sodium chloride infusion, , Intravenous, Continuous, Sherhonda Gaspar, Rossie Muskrat, MD, Last Rate: 20 mL/hr at 12/30/22 0817, 20 mL/hr at 12/30/22 0817  Medications Prior to Admission  Medication Sig Dispense Refill Last Dose   albuterol (PROVENTIL HFA;VENTOLIN HFA) 108 (90 Base) MCG/ACT inhaler Inhale 2 puffs into the lungs every 6 (six) hours as needed for wheezing or shortness of breath.   12/29/2022   amLODipine (NORVASC) 10 MG tablet Take 10 mg by mouth every morning.   12/30/2022 at 0500   bisoprolol (ZEBETA) 5 MG tablet Take 5 mg by mouth every morning.   12/30/2022 at 0500   Calcium Carbonate-Vit D-Min (GNP CALCIUM PLUS 600 +D PO) Take 1 tablet by mouth daily.   12/29/2022   celecoxib (CELEBREX) 200 MG capsule Take 200 mg by mouth 2 (two) times daily.   12/29/2022   clindamycin (CLEOCIN T) 1 % external solution Apply 1 application topically 2 (two) times daily as needed (for skin boils).    12/29/2022   clopidogrel (PLAVIX) 75 MG tablet Take 75 mg by mouth daily.   Past Week   fluticasone (FLONASE) 50 MCG/ACT nasal spray Place 2 sprays into both nostrils daily.   12/29/2022   gabapentin (NEURONTIN) 100 MG capsule Take 100 mg by mouth at bedtime.   12/29/2022   hydrochlorothiazide (HYDRODIURIL) 25 MG tablet Take 25 mg by mouth daily.   12/29/2022   hydroxychloroquine (PLAQUENIL) 200 MG tablet Take 200 mg by mouth every other day.   12/29/2022   magnesium oxide (MAG-OX) 400 MG tablet Take  400 mg by mouth every morning.   12/29/2022   methocarbamol (ROBAXIN) 500 MG tablet Take 500 mg by mouth 3 (three) times daily.   12/29/2022   ondansetron (ZOFRAN ODT) 4 MG disintegrating tablet Take 1 tablet (4 mg total) by mouth every 8 (eight) hours as needed for nausea or vomiting. 20 tablet 0 12/29/2022   pantoprazole (PROTONIX) 40 MG tablet Take 40 mg by mouth every morning.   12/29/2022   pravastatin (PRAVACHOL) 40 MG tablet Take 40 mg by mouth at bedtime.   12/29/2022   spironolactone (ALDACTONE) 25 MG tablet Take 25 mg by mouth daily.   12/29/2022   tiZANidine (ZANAFLEX) 4 MG capsule Take 4 mg by mouth 3 (three) times daily as needed for muscle spasms.   12/29/2022   vitamin B-12 (CYANOCOBALAMIN) 500 MCG tablet Take 500 mcg by mouth daily.   12/29/2022   Vitamin D, Ergocalciferol, (DRISDOL) 50000 units CAPS capsule Take 50,000 Units by mouth every 14 (fourteen) days.    12/29/2022   cyclobenzaprine (FLEXERIL) 10 MG tablet Take 5 mg by mouth daily as needed for muscle spasms.       nitroGLYCERIN (NITROSTAT) 0.4 MG SL tablet Place 0.4 mg under the tongue every 5 (five) minutes as needed.        Allergies  Allergen Reactions   Codeine Itching     Past Medical History:  Diagnosis Date   Allergic rhinitis  ASCVD (arteriosclerotic cardiovascular disease) 04/14/2013   Overview:  A. CVA - 2008 B. Hyperlipidemia C. Cigarettes   Asthma    B12 deficiency    Chickenpox    Chronic kidney disease, stage III (moderate) (HCC) 10/07/2013   Confusion    Connective tissue disease (HCC)    COPD, mild (HCC) 10/07/2013   Depression    Dysphagia    Dyspnea    Fibromyalgia    GERD (gastroesophageal reflux disease)    Glenohumeral arthritis, right    H/O degenerative disc disease    Headache    History of embolic stroke without residual deficits 01/05/2007   Hypercholesterolemia 10/07/2013   Hypertension    Impingement syndrome, shoulder 10/07/2013   right   Lupus    Nocturnal muscle  cramps    Obstructive sleep apnea on CPAP 10/07/2013   Overview:  9 cm   OSA on CPAP    Osteoarthritis    lupus   Pain    intermittent cheat pain x 1 mo since grand daughter passes from brain tumor   Pre-diabetes    Primary osteoarthritis of right knee 08/23/2013   S/P left rotator cuff repair    S/P right rotator cuff repair    Seizure-like activity (HCC)    Stroke (HCC)    2008  no residual   Tear of medial cartilage or meniscus of knee, current 08/23/2013   right   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HCC)    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    Physical Exam  Gen: Alert, oriented. Appears stated age.  HEENT: PERRLA. Lungs: No respiratory distress CV: RRR Abd: soft, benign, no masses Ext: No edema    Planned procedures: Proceed with EGD/colonoscopy. The patient understands the nature of the planned procedure, indications, risks, alternatives and potential complications including but not limited to bleeding, infection, perforation, damage to internal organs and possible oversedation/side effects from anesthesia. The patient agrees and gives consent to proceed.  Please refer to procedure notes for findings, recommendations and patient disposition/instructions.     Regis Bill, MD [facility]

## 2022-12-30 NOTE — Op Note (Signed)
[facility] Patient Name: Bailey Dominguez Procedure Date: 12/30/2022 8:38 AM MRN: [id number] Account #: [id number] Date of Birth: May 09, 1955 Admit Type: Outpatient Age: 67 Room: [facility] ENDO ROOM 3 Gender: Female Note Status: Finalized Instrument Name: Patton Salles Endoscope [id number] Procedure:             Upper GI endoscopy Indications:           Gastric intestinal metaplasia without dysplasia Providers:             Eather Colas MD, MD Referring MD:          Lisabeth Pick. Fields (Referring MD) Medicines:             Monitored Anesthesia Care Complications:         No immediate complications. Estimated blood loss:                         Minimal. Procedure:             Pre-Anesthesia Assessment:                        - Prior to the procedure, a History and Physical was                         performed, and patient medications and allergies were                         reviewed. The patient is competent. The risks and                         benefits of the procedure and the sedation options and                         risks were discussed with the patient. All questions                         were answered and informed consent was obtained.                         Patient identification and proposed procedure were                         verified by the physician, the nurse, the                         anesthesiologist, the anesthetist and the technician                         in the endoscopy suite. Mental Status Examination:                         alert and oriented. Airway Examination: normal                         oropharyngeal airway and neck mobility. Respiratory                         Examination: clear to auscultation. CV Examination:  normal. Prophylactic Antibiotics: The patient does not                         require prophylactic antibiotics. Prior                         Anticoagulants: The patient has taken no  anticoagulant                         or antiplatelet agents. ASA Grade Assessment: III - A                         patient with severe systemic disease. After reviewing                         the risks and benefits, the patient was deemed in                         satisfactory condition to undergo the procedure. The                         anesthesia plan was to use monitored anesthesia care                         (MAC). Immediately prior to administration of                         medications, the patient was re-assessed for adequacy                         to receive sedatives. The heart rate, respiratory                         rate, oxygen saturations, blood pressure, adequacy of                         pulmonary ventilation, and response to care were                         monitored throughout the procedure. The physical                         status of the patient was re-assessed after the                         procedure.                        After obtaining informed consent, the endoscope was                         passed under direct vision. Throughout the procedure,                         the patient's blood pressure, pulse, and oxygen                         saturations were monitored continuously. The Endoscope  was introduced through the mouth, and advanced to the                         second part of duodenum. The upper GI endoscopy was                         accomplished without difficulty. The patient tolerated                         the procedure well. Findings:      The examined esophagus was normal.      The entire examined stomach was normal. Biopsies were taken with a cold       forceps for histology. Estimated blood loss was minimal.      The examined duodenum was normal. Impression:            - Normal esophagus.                        - Normal stomach. Biopsied.                        - Normal examined  duodenum. Recommendation:        - Discharge patient to home.                        - Resume previous diet.                        - Continue present medications.                        - Await pathology results.                        - Return to referring physician as previously                         scheduled. Procedure Code(s):     --- Professional ---                        [phone], Esophagogastroduodenoscopy, flexible,                         transoral; with biopsy, single or multiple Diagnosis Code(s):     --- Professional ---                        K31.A19, Gastric intestinal metaplasia without                         dysplasia, unspecified site CPT copyright 2022 American Medical Association. All rights reserved. The codes documented in this report are preliminary and upon coder review may  be revised to meet current compliance requirements. Eather Colas MD, MD 12/30/2022 9:16:30 AM Number of Addenda: 0 Note Initiated On: 12/30/2022 8:38 AM Estimated Blood Loss:  Estimated blood loss was minimal.      [facility]

## 2022-12-31 ENCOUNTER — Encounter: Payer: Self-pay | Admitting: Gastroenterology

## 2023-01-01 LAB — SURGICAL PATHOLOGY

## 2023-03-21 DIAGNOSIS — M359 Systemic involvement of connective tissue, unspecified: Secondary | ICD-10-CM | POA: Diagnosis not present

## 2023-03-21 DIAGNOSIS — M3500 Sicca syndrome, unspecified: Secondary | ICD-10-CM | POA: Diagnosis not present

## 2023-03-21 DIAGNOSIS — Z79899 Other long term (current) drug therapy: Secondary | ICD-10-CM | POA: Diagnosis not present

## 2023-03-21 DIAGNOSIS — I7300 Raynaud's syndrome without gangrene: Secondary | ICD-10-CM | POA: Diagnosis not present

## 2023-04-14 ENCOUNTER — Other Ambulatory Visit: Payer: Self-pay | Admitting: Family Medicine

## 2023-04-14 DIAGNOSIS — Z1231 Encounter for screening mammogram for malignant neoplasm of breast: Secondary | ICD-10-CM

## 2023-05-15 ENCOUNTER — Ambulatory Visit
Admission: RE | Admit: 2023-05-15 | Discharge: 2023-05-15 | Disposition: A | Discharge status: Home / Self Care | Source: Ambulatory Visit | Attending: Family Medicine | Admitting: Family Medicine

## 2023-05-15 DIAGNOSIS — Z1231 Encounter for screening mammogram for malignant neoplasm of breast: Secondary | ICD-10-CM | POA: Diagnosis not present

## 2023-06-18 DIAGNOSIS — E538 Deficiency of other specified B group vitamins: Secondary | ICD-10-CM | POA: Diagnosis not present

## 2023-06-18 DIAGNOSIS — E7800 Pure hypercholesterolemia, unspecified: Secondary | ICD-10-CM | POA: Diagnosis not present

## 2023-06-18 DIAGNOSIS — G4733 Obstructive sleep apnea (adult) (pediatric): Secondary | ICD-10-CM | POA: Diagnosis not present

## 2023-06-18 DIAGNOSIS — J449 Chronic obstructive pulmonary disease, unspecified: Secondary | ICD-10-CM | POA: Diagnosis not present

## 2023-06-18 DIAGNOSIS — R7303 Prediabetes: Secondary | ICD-10-CM | POA: Diagnosis not present

## 2023-06-18 DIAGNOSIS — E559 Vitamin D deficiency, unspecified: Secondary | ICD-10-CM | POA: Diagnosis not present

## 2023-06-25 DIAGNOSIS — E7800 Pure hypercholesterolemia, unspecified: Secondary | ICD-10-CM | POA: Diagnosis not present

## 2023-06-25 DIAGNOSIS — Z6833 Body mass index (BMI) 33.0-33.9, adult: Secondary | ICD-10-CM | POA: Diagnosis not present

## 2023-06-25 DIAGNOSIS — Z6837 Body mass index (BMI) 37.0-37.9, adult: Secondary | ICD-10-CM | POA: Diagnosis not present

## 2023-06-25 DIAGNOSIS — Z0000 Encounter for general adult medical examination without abnormal findings: Secondary | ICD-10-CM | POA: Diagnosis not present

## 2023-06-25 DIAGNOSIS — I10 Essential (primary) hypertension: Secondary | ICD-10-CM | POA: Diagnosis not present

## 2023-06-25 DIAGNOSIS — J449 Chronic obstructive pulmonary disease, unspecified: Secondary | ICD-10-CM | POA: Diagnosis not present

## 2023-06-25 DIAGNOSIS — R7303 Prediabetes: Secondary | ICD-10-CM | POA: Diagnosis not present

## 2023-06-25 DIAGNOSIS — G4733 Obstructive sleep apnea (adult) (pediatric): Secondary | ICD-10-CM | POA: Diagnosis not present

## 2023-06-25 DIAGNOSIS — M25552 Pain in left hip: Secondary | ICD-10-CM | POA: Diagnosis not present

## 2023-07-09 DIAGNOSIS — R519 Headache, unspecified: Secondary | ICD-10-CM | POA: Diagnosis not present

## 2023-07-09 DIAGNOSIS — Z8673 Personal history of transient ischemic attack (TIA), and cerebral infarction without residual deficits: Secondary | ICD-10-CM | POA: Diagnosis not present

## 2023-07-09 DIAGNOSIS — R410 Disorientation, unspecified: Secondary | ICD-10-CM | POA: Diagnosis not present

## 2023-07-09 DIAGNOSIS — G4733 Obstructive sleep apnea (adult) (pediatric): Secondary | ICD-10-CM | POA: Diagnosis not present

## 2023-10-28 DIAGNOSIS — M898X1 Other specified disorders of bone, shoulder: Secondary | ICD-10-CM | POA: Diagnosis not present

## 2023-10-28 DIAGNOSIS — M3500 Sicca syndrome, unspecified: Secondary | ICD-10-CM | POA: Diagnosis not present

## 2023-10-28 DIAGNOSIS — Z79899 Other long term (current) drug therapy: Secondary | ICD-10-CM | POA: Diagnosis not present

## 2023-10-28 DIAGNOSIS — M359 Systemic involvement of connective tissue, unspecified: Secondary | ICD-10-CM | POA: Diagnosis not present

## 2023-10-28 DIAGNOSIS — I7300 Raynaud's syndrome without gangrene: Secondary | ICD-10-CM | POA: Diagnosis not present

## 2023-10-29 DIAGNOSIS — M898X1 Other specified disorders of bone, shoulder: Secondary | ICD-10-CM | POA: Diagnosis not present

## 2023-10-29 DIAGNOSIS — I7300 Raynaud's syndrome without gangrene: Secondary | ICD-10-CM | POA: Diagnosis not present

## 2023-10-29 DIAGNOSIS — M3500 Sicca syndrome, unspecified: Secondary | ICD-10-CM | POA: Diagnosis not present

## 2023-10-29 DIAGNOSIS — M359 Systemic involvement of connective tissue, unspecified: Secondary | ICD-10-CM | POA: Diagnosis not present

## 2023-10-29 DIAGNOSIS — Z79899 Other long term (current) drug therapy: Secondary | ICD-10-CM | POA: Diagnosis not present

## 2023-12-22 DIAGNOSIS — I2510 Atherosclerotic heart disease of native coronary artery without angina pectoris: Secondary | ICD-10-CM | POA: Diagnosis not present

## 2023-12-22 DIAGNOSIS — I10 Essential (primary) hypertension: Secondary | ICD-10-CM | POA: Diagnosis not present

## 2023-12-22 DIAGNOSIS — R7303 Prediabetes: Secondary | ICD-10-CM | POA: Diagnosis not present

## 2023-12-22 DIAGNOSIS — E7800 Pure hypercholesterolemia, unspecified: Secondary | ICD-10-CM | POA: Diagnosis not present

## 2023-12-22 DIAGNOSIS — J449 Chronic obstructive pulmonary disease, unspecified: Secondary | ICD-10-CM | POA: Diagnosis not present

## 2023-12-22 DIAGNOSIS — G4733 Obstructive sleep apnea (adult) (pediatric): Secondary | ICD-10-CM | POA: Diagnosis not present

## 2023-12-29 DIAGNOSIS — Z23 Encounter for immunization: Secondary | ICD-10-CM | POA: Diagnosis not present

## 2023-12-29 DIAGNOSIS — Z1331 Encounter for screening for depression: Secondary | ICD-10-CM | POA: Diagnosis not present

## 2023-12-29 DIAGNOSIS — Z0000 Encounter for general adult medical examination without abnormal findings: Secondary | ICD-10-CM | POA: Diagnosis not present

## 2023-12-29 DIAGNOSIS — M359 Systemic involvement of connective tissue, unspecified: Secondary | ICD-10-CM | POA: Diagnosis not present

## 2023-12-29 DIAGNOSIS — T7840XD Allergy, unspecified, subsequent encounter: Secondary | ICD-10-CM | POA: Diagnosis not present

## 2023-12-29 DIAGNOSIS — R7303 Prediabetes: Secondary | ICD-10-CM | POA: Diagnosis not present

## 2023-12-29 DIAGNOSIS — Z1283 Encounter for screening for malignant neoplasm of skin: Secondary | ICD-10-CM | POA: Diagnosis not present

## 2023-12-29 DIAGNOSIS — E7800 Pure hypercholesterolemia, unspecified: Secondary | ICD-10-CM | POA: Diagnosis not present

## 2023-12-29 DIAGNOSIS — G4733 Obstructive sleep apnea (adult) (pediatric): Secondary | ICD-10-CM | POA: Diagnosis not present

## 2024-02-10 IMAGING — MG MM DIGITAL SCREENING BILAT W/ TOMO AND CAD
8 series · 8 of 24 positions shown · non-contrast
Comparison: Previous exam(s).

CLINICAL DATA: Screening.

EXAM:
DIGITAL SCREENING BILATERAL MAMMOGRAM WITH TOMOSYNTHESIS AND CAD
TECHNIQUE: Bilateral screening digital craniocaudal and mediolateral oblique
mammograms were obtained. Bilateral screening digital breast
tomosynthesis was performed. The images were evaluated with
computer-aided detection.

[L MLO synth-2D]
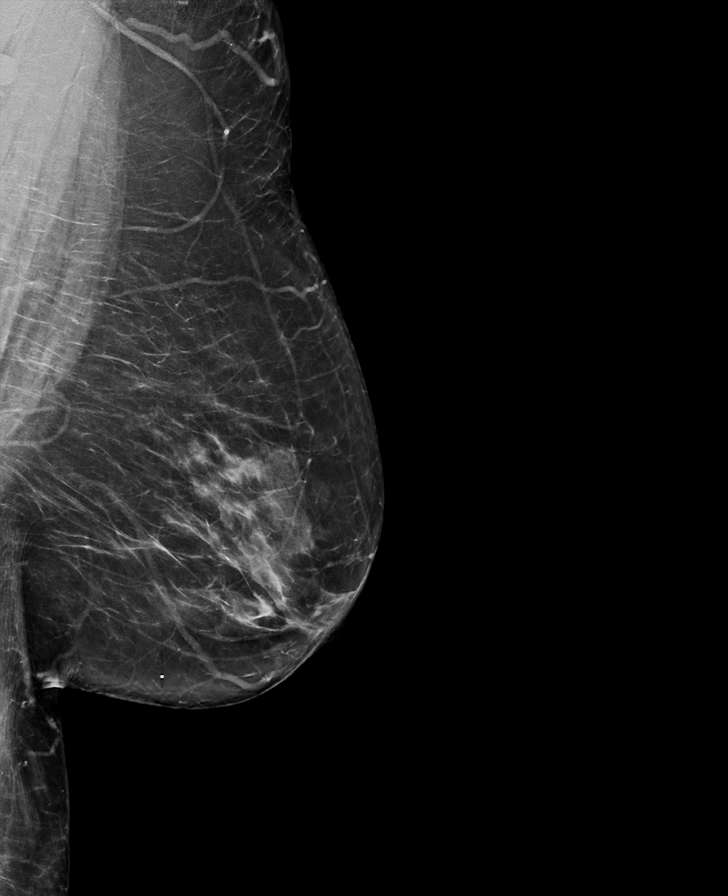

[R CC synth-2D]
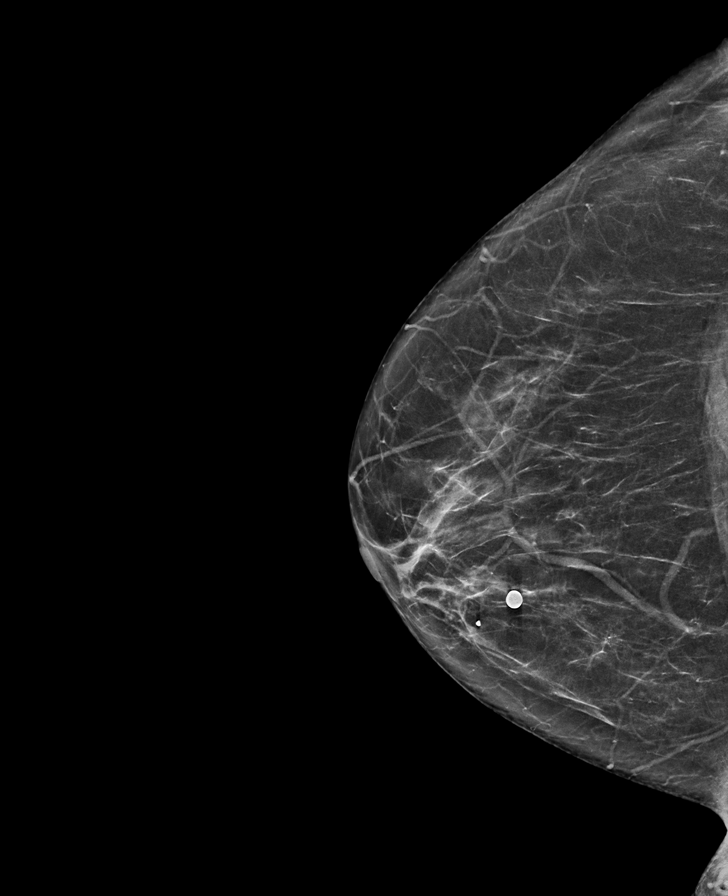

[L CC synth-2D]
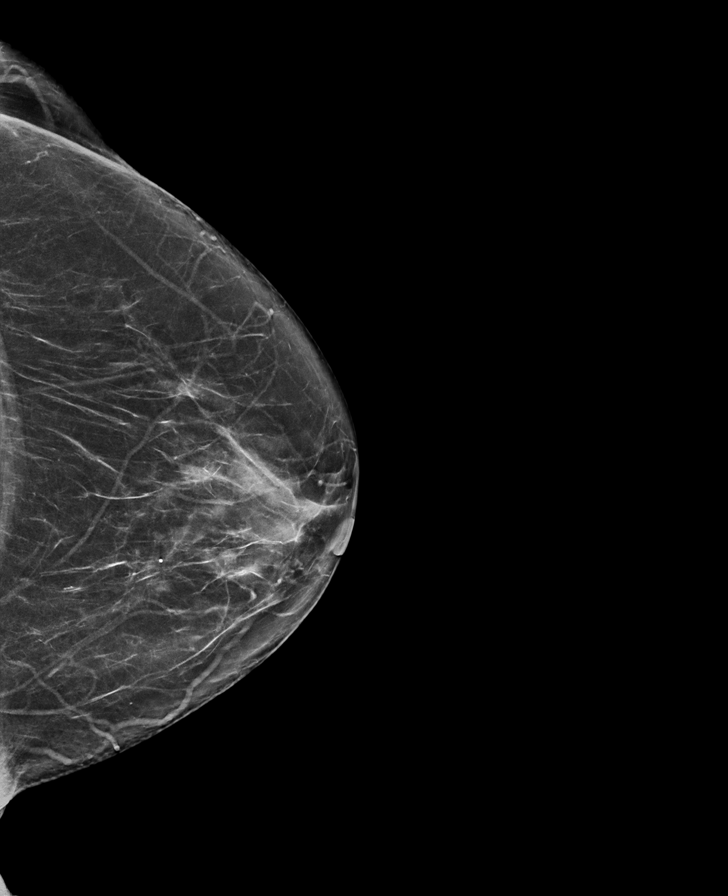

[R MLO synth-2D]
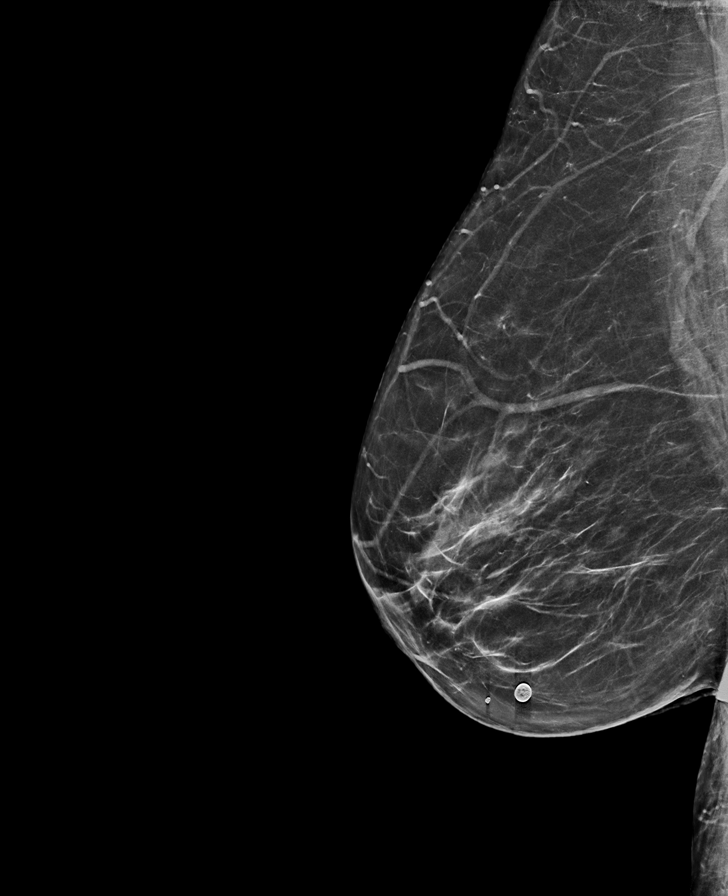

[L CC tomo · tomo slice 35/68.0]
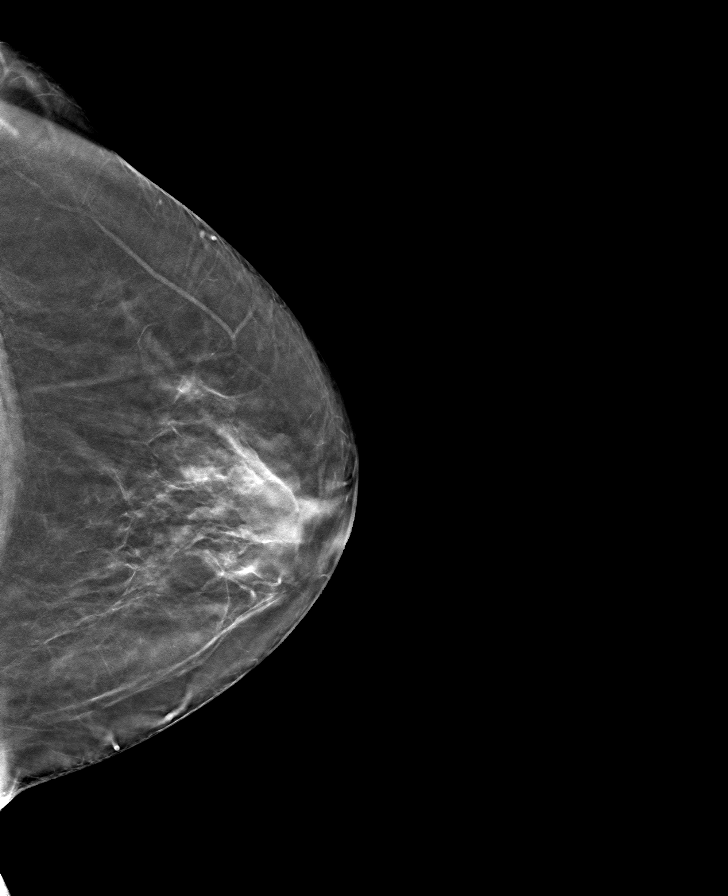

[L MLO tomo · tomo slice 38/75.0]
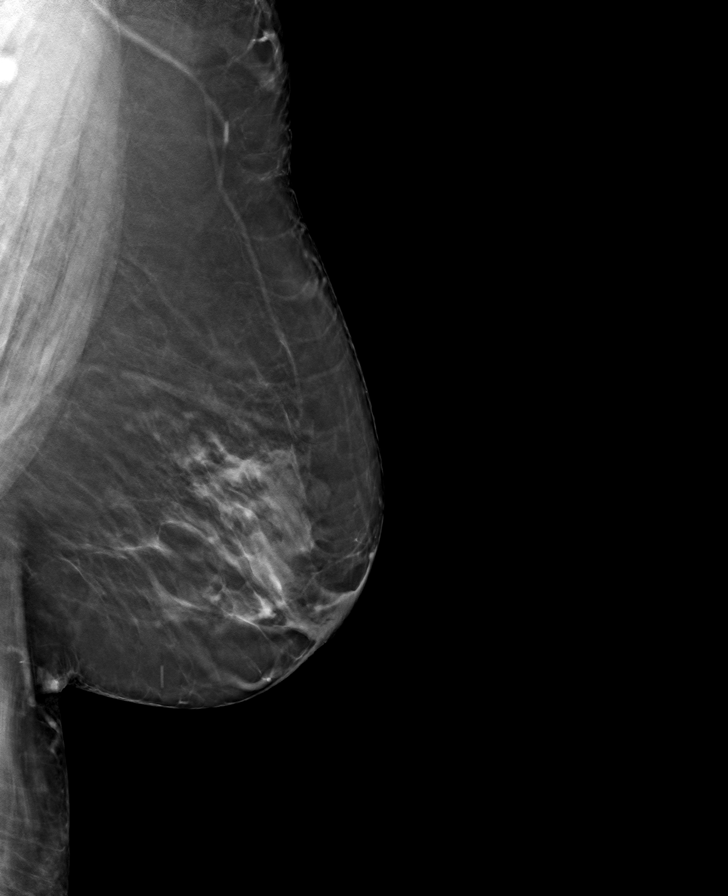

[R CC tomo · tomo slice 33/65.0]
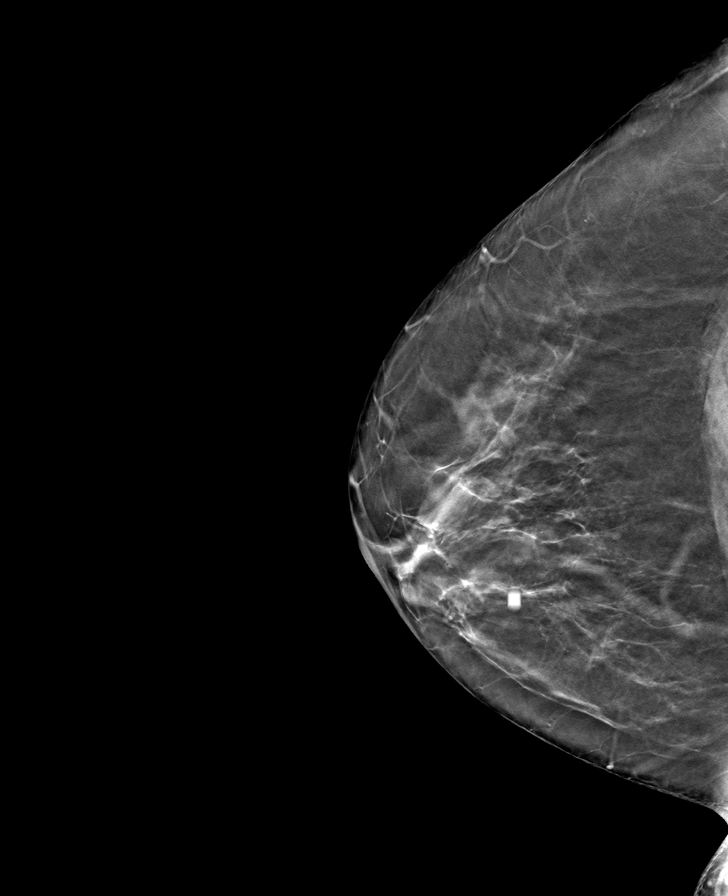

[R MLO tomo · tomo slice 34/67.0]
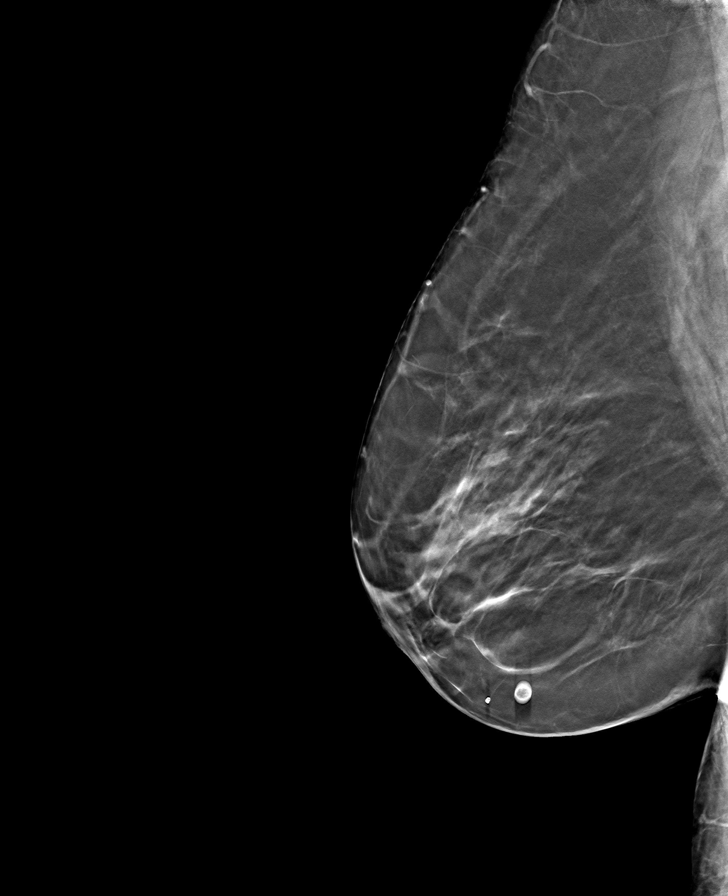

[8 of 24 positions shown; findings below may reference images not displayed]

ACR Breast Density Category b: There are scattered areas of
fibroglandular density.
FINDINGS: There are no findings suspicious for malignancy.
IMPRESSION: No mammographic evidence of malignancy. A result letter of this
screening mammogram will be mailed directly to the patient.

RECOMMENDATION:
Screening mammogram in one year. (Code:51-O-LD2)

BI-RADS CATEGORY  1: Negative.
